# Patient Record
Sex: Male | Born: 1960 | Race: White | Hispanic: No | Marital: Married | Smoking: Former smoker
Health system: Southern US, Community
[De-identification: ages and names within clinical notes are randomized; demographics above are authoritative.]

## PROBLEM LIST (undated history)

## (undated) DIAGNOSIS — I219 Acute myocardial infarction, unspecified: Secondary | ICD-10-CM

## (undated) DIAGNOSIS — Z789 Other specified health status: Secondary | ICD-10-CM

## (undated) DIAGNOSIS — R9430 Abnormal result of cardiovascular function study, unspecified: Secondary | ICD-10-CM

## (undated) DIAGNOSIS — K219 Gastro-esophageal reflux disease without esophagitis: Secondary | ICD-10-CM

## (undated) DIAGNOSIS — K449 Diaphragmatic hernia without obstruction or gangrene: Secondary | ICD-10-CM

## (undated) DIAGNOSIS — I2510 Atherosclerotic heart disease of native coronary artery without angina pectoris: Secondary | ICD-10-CM

## (undated) DIAGNOSIS — E785 Hyperlipidemia, unspecified: Secondary | ICD-10-CM

## (undated) DIAGNOSIS — I10 Essential (primary) hypertension: Secondary | ICD-10-CM

## (undated) DIAGNOSIS — Z7289 Other problems related to lifestyle: Secondary | ICD-10-CM

## (undated) HISTORY — DX: Hyperlipidemia, unspecified: E78.5

## (undated) HISTORY — PX: CORONARY ANGIOPLASTY WITH STENT PLACEMENT: SHX49

## (undated) HISTORY — DX: Atherosclerotic heart disease of native coronary artery without angina pectoris: I25.10

## (undated) HISTORY — DX: Abnormal result of cardiovascular function study, unspecified: R94.30

## (undated) HISTORY — DX: Essential (primary) hypertension: I10

## (undated) HISTORY — DX: Other problems related to lifestyle: Z72.89

## (undated) HISTORY — DX: Diaphragmatic hernia without obstruction or gangrene: K44.9

## (undated) HISTORY — DX: Other specified health status: Z78.9

## (undated) HISTORY — DX: Gastro-esophageal reflux disease without esophagitis: K21.9

---
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
## 2000-06-17 ENCOUNTER — Emergency Department (HOSPITAL_COMMUNITY): Admission: EM | Admit: 2000-06-17 | Discharge: 2000-06-17 | Payer: Self-pay | Admitting: Emergency Medicine

## 2000-12-27 ENCOUNTER — Emergency Department (HOSPITAL_COMMUNITY): Admission: EM | Admit: 2000-12-27 | Discharge: 2000-12-27 | Payer: Self-pay

## 2001-01-09 ENCOUNTER — Emergency Department (HOSPITAL_COMMUNITY): Admission: EM | Admit: 2001-01-09 | Discharge: 2001-01-09 | Payer: Self-pay | Admitting: Emergency Medicine

## 2008-03-30 ENCOUNTER — Emergency Department (HOSPITAL_BASED_OUTPATIENT_CLINIC_OR_DEPARTMENT_OTHER): Admission: EM | Admit: 2008-03-30 | Discharge: 2008-03-30 | Payer: Self-pay | Admitting: Emergency Medicine

## 2008-03-30 ENCOUNTER — Ambulatory Visit: Payer: Self-pay | Admitting: Diagnostic Radiology

## 2011-01-02 ENCOUNTER — Observation Stay (HOSPITAL_COMMUNITY)
Admission: EM | Admit: 2011-01-02 | Discharge: 2011-01-03 | Disposition: A | Discharge status: Home / Self Care | Payer: Self-pay | Attending: Cardiology | Admitting: Cardiology

## 2011-01-02 ENCOUNTER — Emergency Department (HOSPITAL_COMMUNITY): Payer: Self-pay

## 2011-01-02 DIAGNOSIS — R079 Chest pain, unspecified: Secondary | ICD-10-CM

## 2011-01-02 DIAGNOSIS — I214 Non-ST elevation (NSTEMI) myocardial infarction: Principal | ICD-10-CM | POA: Insufficient documentation

## 2011-01-02 DIAGNOSIS — I2589 Other forms of chronic ischemic heart disease: Secondary | ICD-10-CM | POA: Insufficient documentation

## 2011-01-02 DIAGNOSIS — I10 Essential (primary) hypertension: Secondary | ICD-10-CM | POA: Insufficient documentation

## 2011-01-02 DIAGNOSIS — I2510 Atherosclerotic heart disease of native coronary artery without angina pectoris: Secondary | ICD-10-CM

## 2011-01-02 DIAGNOSIS — R0602 Shortness of breath: Secondary | ICD-10-CM | POA: Insufficient documentation

## 2011-01-02 DIAGNOSIS — E785 Hyperlipidemia, unspecified: Secondary | ICD-10-CM | POA: Insufficient documentation

## 2011-01-02 LAB — CBC
HCT: 41.6 % (ref 39.0–52.0)
Hemoglobin: 14.2 g/dL (ref 13.0–17.0)
MCH: 32.1 pg (ref 26.0–34.0)
MCHC: 34.1 g/dL (ref 30.0–36.0)
MCV: 94.1 fL (ref 78.0–100.0)
Platelets: 220 10*3/uL (ref 150–400)
RBC: 4.42 MIL/uL (ref 4.22–5.81)
RDW: 12.6 % (ref 11.5–15.5)
WBC: 9.5 10*3/uL (ref 4.0–10.5)

## 2011-01-02 LAB — PROTIME-INR
INR: 1 (ref 0.00–1.49)
Prothrombin Time: 13.4 seconds (ref 11.6–15.2)

## 2011-01-02 LAB — COMPREHENSIVE METABOLIC PANEL
Albumin: 3.7 g/dL (ref 3.5–5.2)
Alkaline Phosphatase: 71 U/L (ref 39–117)
BUN: 11 mg/dL (ref 6–23)
Creatinine, Ser: 0.89 mg/dL (ref 0.50–1.35)
GFR calc Af Amer: >60 mL/min (ref >60)
Glucose, Bld: 90 mg/dL (ref 70–99)
Potassium: 4.1 mEq/L (ref 3.5–5.1)
Total Bilirubin: 0.3 mg/dL (ref 0.3–1.2)
Total Protein: 6.6 g/dL (ref 6.0–8.3)

## 2011-01-02 LAB — CARDIAC PANEL(CRET KIN+CKTOT+MB+TROPI)
Relative Index: 6.9 — ABNORMAL HIGH (ref 0.0–2.5)
Total CK: 179 U/L (ref 7–232)

## 2011-01-02 LAB — POCT I-STAT TROPONIN I: Troponin i, poc: 0.17 ng/mL (ref 0.00–0.08)

## 2011-01-02 LAB — TSH: TSH: 1.815 u[IU]/mL (ref 0.350–4.500)

## 2011-01-02 LAB — HEMOGLOBIN A1C: Mean Plasma Glucose: 111 mg/dL (ref <117)

## 2011-01-02 LAB — POCT ACTIVATED CLOTTING TIME: Activated Clotting Time: 672 seconds

## 2011-01-03 DIAGNOSIS — I214 Non-ST elevation (NSTEMI) myocardial infarction: Secondary | ICD-10-CM

## 2011-01-03 LAB — BASIC METABOLIC PANEL
CO2: 26 mEq/L (ref 19–32)
Chloride: 106 mEq/L (ref 96–112)
GFR calc Af Amer: >60 mL/min (ref >60)
Potassium: 3.9 mEq/L (ref 3.5–5.1)

## 2011-01-03 LAB — LIPID PANEL
Cholesterol: 195 mg/dL (ref 0–200)
HDL: 46 mg/dL (ref >39)
Total CHOL/HDL Ratio: 4.2 RATIO
Triglycerides: 105 mg/dL (ref <150)
VLDL: 21 mg/dL (ref 0–40)

## 2011-01-03 LAB — CBC
Platelets: 228 10*3/uL (ref 150–400)
RBC: 4.48 MIL/uL (ref 4.22–5.81)
RDW: 12.7 % (ref 11.5–15.5)
WBC: 11.5 10*3/uL — ABNORMAL HIGH (ref 4.0–10.5)
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
## 2011-01-05 NOTE — Discharge Summary (Addendum)
Ernest Bishop, Ernest Bishop NO.:  [id number]  MEDICAL RECORD NO.:  [id number]  LOCATION:  2501                         FACILITY:  [facility]  PHYSICIAN:  Cassell Clement, M.D. DATE OF BIRTH:  1960-07-28  DATE OF ADMISSION:  01/02/2011 DATE OF DISCHARGE:  01/03/2011                              DISCHARGE SUMMARY   DISCHARGE DIAGNOSES: 1. Non-ST-elevation myocardial infarction.     a.     Secondary to severe obtuse marginal stenosis this admission.     b.     Newly diagnosed two-vessel coronary artery disease status      post percutaneous transluminal coronary angioplasty/drug-eluting      stent placement to the obtuse marginal, also residual chronically      occluded small right coronary artery with collaterals. 2. Ischemic cardiomyopathy with ejection fraction of 40% by     catheterization, January 03, 2011.     a.     Recommend followup echo as outpatient. 3. Uncontrolled hypertension, medication initiated this admission. 4. Dyslipidemia, lipids were initiated.     a.     Outpatient fasting lipids and LFTs scheduled for 6 weeks. 5. Regular alcohol use, counseled on cutting down. 6. History of hiatal hernia.  HOSPITAL COURSE:  Ernest Bishop is a 50 year old gentleman with no prior cardiac history but history of hypertension that has gone untreated for at least the past year.  He presented to the [facility] with complaints of chest pain associated nausea, diaphoresis, and shortness of breath.  His initial EKG was unremarkable.  Point-of-care troponin was 0.17.  His symptoms were felt very concerning for unstable angina/NSTEMI, so he underwent cardiac catheterization which demonstrated a severe stenosis in the OM branch of the circumflex.  Dr. Clifton James successfully placed a drug-eluting stent x1 successfully.  He was also noted to have a chronically occluded small nondominant RCA with collaterals filling.  EF was 40% by catheterization.  He was initiated on  ACE inhibitor, beta-blocker, aspirin, statin, and Effient.  His blood pressure, which had been uncontrolled on admission was watched and varied from the 140-160 range by day 2.  His lisinopril will be started today and we will also add hydrochlorothiazide.  The patient is doing much better and has no chest pain or shortness of breath.  Medications are being uptitrated for his blood pressure.  Of note, he did rule in for an NSTEMI of the peak troponin of 3.45.  The patient is very anxious to be discharged today.  He ambulated with Cardiac Rehab without difficulty.  Dr. Patty Sermons has seen and examined him today and feels he is stable for discharge.  DISCHARGE LABORATORY DATA:  Troponin 3.45 at peak, with recent 3.16. Sodium 139, potassium 3.9, chloride 106, CO2 26, glucose 101, BUN 8, and creatinine 0.80.  LFTs were normal on January 02, 2011.  WBC 11.5, hemoglobin 14.5, hematocrit 42.5, and platelet count 228.  STUDIES: 1. Chest x-ray showed no active disease. 2. Cardiac catheterization, January 02, 2011, please see full report     for details as well as HPI for summary.  DISCHARGE MEDICATIONS: 1. Aspirin 81 mg daily. 2. Hydrochlorothiazide 12.5 mg daily. 3. Lipitor 20 mg at bedtime. 4. Lisinopril  10 mg daily. 5. Metoprolol tartrate 25 mg b.i.d. 6. Nitro sublingual 0.4 mg every 5 minutes as needed up to 3 doses for     chest pain. 7. Effient 10 mg daily.  DISPOSITION:  Ernest Bishop will be discharged in stable condition to home.  He is not to return to work until cleared by cardiologist in the office.  He may not lift anything over 5 pounds for 1 week, drive for 2 days, or participate in sexual activity for 1 week.  He is to follow a low-sodium heart-healthy diet.  To call or return if he notices any pain, swelling, bleeding, or pus at his cath site.  Our office will call him with a followup appointment for the next week.  He is also instructed to get a primary care doctor.   He is very interested in discussing antianxiety medications in the future.  Ernest Bishop seems very motivated to make healthy changes to his lifestyle.  DURATION OF DISCHARGE ENCOUNTER:  Greater than 30 minutes including physician PA time.     Ronie Spies, P.A.C.   ______________________________ Cassell Clement, M.D.    DD/MEDQ  D:  01/03/2011  T:  01/03/2011  Job:  [id number]  cc:   Gordy Savers, MD  Electronically Signed by Cassell Clement M.D. on 01/05/2011 09:26:14 AM Electronically Signed by Ronie Spies  on 01/07/2011 09:30:22 PM

## 2011-01-05 NOTE — Cardiovascular Report (Signed)
NAMELAMONTE, HARTT NO.:  [id number]  MEDICAL RECORD NO.:  [id number]  LOCATION:  2501                         FACILITY:  [facility]  PHYSICIAN:  Verne Carrow, MDDATE OF BIRTH:  [birth year]  DATE OF PROCEDURE:  01/02/2011 DATE OF DISCHARGE:                           CARDIAC CATHETERIZATION   PRIMARY CARDIOLOGIST:  Luis Abed, MD, [facility]  PROCEDURES PERFORMED: 1. Left heart catheterization. 2. Selective coronary angiography. 3. Left ventricular angiogram. 4. Percutaneous transluminal coronary angioplasty with placement of a     drug-eluting stent in the proximal portion of the first obtuse     marginal branch of the circumflex artery.  OPERATOR:  Verne Carrow, MD  ARTERIAL ACCESS SITE:  Right radial artery.  INDICATIONS:  This is a 50 year old Caucasian male with a history of hypertension who presented to the emergency department today with complaints of chest pain.  The patient's EKG showed no ischemia.  His initial troponin was positive mildly at 0.17 with the point of care labs.  Because of this, a diagnostic catheterization was arranged.  PROCEDURE IN DETAIL:  The patient was brought to the main cardiac catheterization laboratory after signing informed consent for the procedure.  The right wrist was assessed with an Allen's test.  The Allen test was positive.  The right wrist was prepped and draped in sterile fashion.  Lidocaine 1% was used for local anesthesia.  A 5- French sheath was inserted without difficulty into the right radial artery.  Verapamil 3 mg was given through the sheath after insertion. Intravenous heparin 5000 units was given after sheath insertion. Standard diagnostic catheters were used to perform selective coronary angiography.  A pigtail catheter was used to perform a left ventricular angiogram.  At this point, we elected to proceed intervention of the severe stenosis in the first obtuse marginal branch of the  circumflex artery.  The sheath was upsized to a 6-French sheath.  The patient was given a bolus of Angiomax and a drip was started.  The patient was given 60 mg of Effient.  We then engaged the left main artery with a 6-French XB 3.5 guiding catheter.  When the ACT was greater than 200, we passed a cougar intracoronary wire down the length of the obtuse marginal branch into the distal vessel.  A 2.5 x 12-mm balloon was used to predilate the lesion.  A 2.5 x 60-mm Promus element drug-eluting stent was carefully positioned in the obtuse marginal branch and deployed without difficulty.  A 2.75 x 12-mm noncompliant balloon was positioned inside the stented segment and inflated to 14 atmospheres.  There was an excellent angiographic result.  The stenosis was taken from 99% to 0%. There was excellent flow into the distal vessel.  The guiding catheter was removed.  The sheath was removed and a Terumo hemostasis band was applied over the arteriotomy site.  The patient was taken to the recovery area in stable condition.  HEMODYNAMIC FINDINGS:  Central aortic pressure 181/120.  Left ventricular pressure 172/13/23.  ANGIOGRAPHIC FINDINGS: 1. The left main coronary artery had no evidence of disease. 2. Left anterior descending was a large vessel that coursed to the     apex and gave off a  moderate-sized diagonal branch with mild plaque     disease.  There were no flow-limiting lesions in this vessel. 3. The circumflex artery had mild plaque disease in the proximal     midportion.  First obtuse marginal branch was a moderate-sized     vessel with 99% stenosis which was felt to be the culprit lesion.     The second and third obtuse marginal branches had mild plaque     disease. 4. The right coronary artery is a small nondominant vessel with 100%     mid occlusion which is felt to be chronic.  The distal vessel fills     from right to right bridging collaterals. 5. Left ventricular angiogram was  performed in the RAO projection     showed anterolateral hypokinesis with ejection fraction of 40%.  IMPRESSION: 1. Double-vessel coronary artery disease. 2. Non-ST-elevation myocardial infarction secondary to severe stenosis     in the obtuse marginal branch of the circumflex artery.  Now status     post successful PTCA with placement of a drug-eluting stent x1 in     the obtuse marginal branch. 3. Chronically occluded small nondominant right coronary artery with     collateral filling. 4. Mild left ventricular systolic dysfunction.  RECOMMENDATIONS:  The patient should be continued on aspirin and Effient for at least 1 year.  We will also continue his beta-blocker and statin. He should be started on ACE inhibitor if his blood pressure will tolerate.     Verne Carrow, MD     CM/MEDQ  D:  01/02/2011  T:  01/03/2011  Job:  [id number]  cc:   Luis Abed, MD, [facility]  Electronically Signed by Verne Carrow MD on 01/05/2011 09:10:36 AM

## 2011-01-12 ENCOUNTER — Encounter: Payer: Self-pay | Admitting: Cardiology

## 2011-01-12 DIAGNOSIS — I2510 Atherosclerotic heart disease of native coronary artery without angina pectoris: Secondary | ICD-10-CM | POA: Insufficient documentation

## 2011-01-12 DIAGNOSIS — Z7289 Other problems related to lifestyle: Secondary | ICD-10-CM | POA: Insufficient documentation

## 2011-01-12 DIAGNOSIS — I10 Essential (primary) hypertension: Secondary | ICD-10-CM | POA: Insufficient documentation

## 2011-01-12 DIAGNOSIS — E785 Hyperlipidemia, unspecified: Secondary | ICD-10-CM | POA: Insufficient documentation

## 2011-01-12 DIAGNOSIS — Z789 Other specified health status: Secondary | ICD-10-CM | POA: Insufficient documentation

## 2011-01-13 ENCOUNTER — Ambulatory Visit (INDEPENDENT_AMBULATORY_CARE_PROVIDER_SITE_OTHER): Payer: Self-pay | Admitting: Cardiology

## 2011-01-13 ENCOUNTER — Encounter: Payer: Self-pay | Admitting: Cardiology

## 2011-01-13 DIAGNOSIS — E785 Hyperlipidemia, unspecified: Secondary | ICD-10-CM

## 2011-01-13 DIAGNOSIS — I2510 Atherosclerotic heart disease of native coronary artery without angina pectoris: Secondary | ICD-10-CM

## 2011-01-13 DIAGNOSIS — I10 Essential (primary) hypertension: Secondary | ICD-10-CM

## 2011-01-13 DIAGNOSIS — I428 Other cardiomyopathies: Secondary | ICD-10-CM

## 2011-01-13 MED ORDER — LISINOPRIL 20 MG PO TABS: 20.0000 mg | ORAL_TABLET | Freq: Every day | ORAL | Status: DC

## 2011-01-13 NOTE — Assessment & Plan Note (Signed)
The patient is status post recent circumflex occlusion with urgent opening with a stent.  We need to reassess his LV function and an echo will be obtained.  He has not had any recurrent significant symptoms.  He is on aspirin and Prasugrel.  These will be continued.

## 2011-01-13 NOTE — Assessment & Plan Note (Signed)
The patient's ejection fraction was 40% in the catheter lab.  We need to reassess now that his circumflex has been opened.  There may be a component of hypertensive disease also.  Blood pressure is reasonable today but his lisinopril dose will be increased.  He is on a beta blocker and an ACE inhibitor.  I will decide over time about the titration upwards of other medicines.

## 2011-01-13 NOTE — Patient Instructions (Signed)
Your physician recommends that you schedule a follow-up appointment in: 10 DAYS WITH DR Myrtis Ser Your physician has recommended you make the following change in your medication: INCREASE LISINOPRIL TO 20 MG EVERY DAY   Your physician has requested that you have an echocardiogram. Echocardiography is a painless test that uses sound waves to create images of your heart. It provides your doctor with information about the size and shape of your heart and how well your heart's chambers and valves are working. This procedure takes approximately one hour. There are no restrictions for this procedure. EVAL LV FUNCTION   HAVE DONE IN 1 WEEK PRIOR TO APPT WITH DR Myrtis Ser

## 2011-01-13 NOTE — Assessment & Plan Note (Signed)
There is borderline control of his blood pressure today.  I will be increasing his lisinopril dose.  I will not be increasing his beta blocker dose at this time.  His rate at home was 52 and he is worried that he might have fatigue from bradycardia
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
## 2011-01-13 NOTE — Assessment & Plan Note (Signed)
Patient is on medications for his lipids.

## 2011-01-19 ENCOUNTER — Ambulatory Visit (HOSPITAL_COMMUNITY): Payer: Self-pay | Attending: Cardiology | Admitting: Radiology

## 2011-01-19 DIAGNOSIS — R079 Chest pain, unspecified: Secondary | ICD-10-CM | POA: Insufficient documentation

## 2011-01-19 DIAGNOSIS — E785 Hyperlipidemia, unspecified: Secondary | ICD-10-CM | POA: Insufficient documentation

## 2011-01-19 DIAGNOSIS — I10 Essential (primary) hypertension: Secondary | ICD-10-CM | POA: Insufficient documentation

## 2011-01-19 DIAGNOSIS — I2589 Other forms of chronic ischemic heart disease: Secondary | ICD-10-CM

## 2011-01-19 DIAGNOSIS — I2510 Atherosclerotic heart disease of native coronary artery without angina pectoris: Secondary | ICD-10-CM

## 2011-01-20 ENCOUNTER — Ambulatory Visit (INDEPENDENT_AMBULATORY_CARE_PROVIDER_SITE_OTHER): Payer: Self-pay | Admitting: Family Medicine

## 2011-01-20 ENCOUNTER — Encounter: Payer: Self-pay | Admitting: Family Medicine

## 2011-01-20 VITALS — BP 110/64 | HR 72 | Temp 98.6°F | Ht 70.75 in | Wt 227.0 lb

## 2011-01-20 DIAGNOSIS — F419 Anxiety disorder, unspecified: Secondary | ICD-10-CM

## 2011-01-20 DIAGNOSIS — Z23 Encounter for immunization: Secondary | ICD-10-CM

## 2011-01-20 DIAGNOSIS — I10 Essential (primary) hypertension: Secondary | ICD-10-CM

## 2011-01-20 DIAGNOSIS — F411 Generalized anxiety disorder: Secondary | ICD-10-CM

## 2011-01-20 DIAGNOSIS — I2510 Atherosclerotic heart disease of native coronary artery without angina pectoris: Secondary | ICD-10-CM

## 2011-01-20 DIAGNOSIS — E785 Hyperlipidemia, unspecified: Secondary | ICD-10-CM

## 2011-01-20 MED ORDER — ALPRAZOLAM 0.5 MG PO TABS: 0.5000 mg | ORAL_TABLET | Freq: Three times a day (TID) | ORAL | Status: AC | PRN
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
## 2011-01-21 ENCOUNTER — Ambulatory Visit (INDEPENDENT_AMBULATORY_CARE_PROVIDER_SITE_OTHER): Payer: Self-pay | Admitting: Cardiology

## 2011-01-21 ENCOUNTER — Encounter: Payer: Self-pay | Admitting: Cardiology

## 2011-01-21 DIAGNOSIS — IMO0002 Reserved for concepts with insufficient information to code with codable children: Secondary | ICD-10-CM | POA: Insufficient documentation

## 2011-01-21 DIAGNOSIS — I2510 Atherosclerotic heart disease of native coronary artery without angina pectoris: Secondary | ICD-10-CM

## 2011-01-21 DIAGNOSIS — R0989 Other specified symptoms and signs involving the circulatory and respiratory systems: Secondary | ICD-10-CM

## 2011-01-21 DIAGNOSIS — R9430 Abnormal result of cardiovascular function study, unspecified: Secondary | ICD-10-CM

## 2011-01-21 DIAGNOSIS — I10 Essential (primary) hypertension: Secondary | ICD-10-CM

## 2011-01-21 NOTE — Patient Instructions (Signed)
Your physician wants you to follow-up in: 3 MONTHS WITH DR Chancy Hurter will receive a reminder letter in the mail two months in advance. If you don't receive a letter, please call our office to schedule the follow-up appointment. Your physician recommends that you continue on your current medications as directed. Please refer to the Current Medication list given to you today.

## 2011-01-21 NOTE — Assessment & Plan Note (Signed)
Coronary disease is stable.  We now know that he has return of normal function.  I will not be changing his medicines today.  He is to increase his activity and will see him back in 3 months.

## 2011-01-21 NOTE — Assessment & Plan Note (Signed)
Blood pressure is now nicely controlled.  No change in therapy.

## 2011-01-21 NOTE — Assessment & Plan Note (Signed)
His ejection fraction has now normalized to 55% with no definite focal wall motion abnormalities.  He clearly benefited from his urgent PCI.

## 2011-01-21 NOTE — Progress Notes (Signed)
HPI Patient is seen for follow up of coronary disease.  He had a non-ST elevation MI January 02, 2011.  He received a drug-eluting stent to an occluded OM.  He had a chronically occluded small right coronary with collaterals.  He did very well.  Ejection fraction was in the 40% range in the catheterization lab.  Therefore echo was done on January 19, 2011.  I have personally reviewed the echo images.  I agree with the reading that his ejection fraction is now in the 55% range with no significant wall motion abnormalities.  He's had an excellent recovery.  He is feeling well and increasing his activities.  He seems quite motivated.   Allergies  Allergen Reactions  . Codeine Itching    Current Outpatient Prescriptions  Medication Sig Dispense Refill  . ALPRAZolam (XANAX) 0.5 MG tablet Take 1 tablet (0.5 mg total) by mouth 3 (three) times daily as needed for sleep or anxiety.  60 tablet  2  . aspirin 81 MG tablet Take 81 mg by mouth daily.        Marland Kitchen atorvastatin (LIPITOR) 20 MG tablet Take 20 mg by mouth daily.        . hydrochlorothiazide (MICROZIDE) 12.5 MG capsule Take 12.5 mg by mouth daily.        Marland Kitchen lisinopril (PRINIVIL,ZESTRIL) 20 MG tablet Take 1 tablet (20 mg total) by mouth daily.  30 tablet  11  . metoprolol tartrate (LOPRESSOR) 25 MG tablet Take 25 mg by mouth 2 (two) times daily.        . nitroGLYCERIN (NITROSTAT) 0.4 MG SL tablet Place 0.4 mg under the tongue every 5 (five) minutes as needed.        . prasugrel (EFFIENT) 10 MG TABS Take 10 mg by mouth daily.          History   Social History  . Marital Status: Married    Spouse Name: N/A    Number of Children: 1  . Years of Education: N/A   Occupational History  . flooring    Social History Main Topics  . Smoking status: Former Smoker    Types: Cigarettes  . Smokeless tobacco: Never Used  . Alcohol Use: 3.5 oz/week    7 drink(s) per week     2-3 12oz cans of beer daily  . Drug Use: No  . Sexually Active: Not on  file   Other Topics Concern  . Not on file   Social History Narrative  . No narrative on file    Family History  Problem Relation Age of Onset  . Hypertension Mother   . Arthritis Mother   . Tremor Mother   . Alcohol abuse Father     had heart surgery, family is estranged from father  . Hypertension Brother     Past Medical History  Diagnosis Date  . CAD (coronary artery disease)     Non-STEMI January 02, 2011, DES to OM, residual chronically occluded small RCA with collaterals  . Cardiomyopathy     Ischemic,,Hypertensive  . Ejection fraction < 50%     EF of 40%, catheterization, September, 2012, needs followup echo  . Hypertension   . Dyslipidemia   . Alcohol use     Regular alcohol use, to cut down   . Hiatal hernia     history  . GERD (gastroesophageal reflux disease)   . Hyperlipidemia     Past Surgical History  Procedure Date  . Cardiac catheterization 01/02/2011  .  Other surgical history     intestinal repair surgery after traumatic injury at the age of 7    ROS  Patient denies fever, chills, headache, sweats, rash, he is hearing, chest pain, cough, nausea vomiting, urinary symptoms.  All other systems are reviewed and are negative. PHYSICAL EXAM Patient is here with his wife.  He is oriented to person time and place.  Affect is normal.  Head is atraumatic.  Lungs are clear.  Respiratory effort is nonlabored.  Cardiac exam reveals S1-S2.  No clicks or significant murmurs.  The abdomen is soft it is improved edema. Filed Vitals:   01/21/11 1001  BP: 112/66  Pulse: 75  Height: 5\' 10"  (1.778 m)  Weight: 224 lb (101.606 kg)    EKG is not done today.  ASSESSMENT & PLAN

## 2011-01-31 ENCOUNTER — Other Ambulatory Visit: Payer: Self-pay | Admitting: Internal Medicine

## 2011-03-30 ENCOUNTER — Encounter: Payer: Self-pay | Admitting: Family Medicine

## 2011-03-30 ENCOUNTER — Ambulatory Visit (INDEPENDENT_AMBULATORY_CARE_PROVIDER_SITE_OTHER): Payer: Self-pay | Admitting: Family Medicine

## 2011-03-30 VITALS — BP 122/84 | HR 53 | Temp 99.0°F | Wt 220.0 lb

## 2011-03-30 DIAGNOSIS — J329 Chronic sinusitis, unspecified: Secondary | ICD-10-CM

## 2011-03-30 DIAGNOSIS — F419 Anxiety disorder, unspecified: Secondary | ICD-10-CM

## 2011-03-30 DIAGNOSIS — F411 Generalized anxiety disorder: Secondary | ICD-10-CM

## 2011-03-30 MED ORDER — ALPRAZOLAM 0.5 MG PO TABS: 0.5000 mg | ORAL_TABLET | Freq: Three times a day (TID) | ORAL | Status: AC | PRN

## 2011-03-30 MED ORDER — AZITHROMYCIN 250 MG PO TABS: ORAL_TABLET | ORAL | Status: AC

## 2011-03-30 NOTE — Progress Notes (Signed)
  Subjective:    Patient ID: Ernest Bishop, male    DOB: 06-02-1960, 50 y.o.   MRN: [id number]  HPI Here to follow up on anxiety. We started him on Xanax in October after he had an MI in September and was dealing with a number of stressful life issues. He has done well with this, averaging twice a day. He is back to work part time now. He has had a stuffy head, PND, and dry cough for the past 3 weeks. No fever. No SOB or chest pain.    Review of Systems  Constitutional: Negative.   HENT: Positive for congestion, postnasal drip and sinus pressure.   Eyes: Negative.   Respiratory: Positive for cough.   Cardiovascular: Negative.        Objective:   Physical Exam  Constitutional: He appears well-developed and well-nourished.  HENT:  Right Ear: External ear normal.  Left Ear: External ear normal.  Nose: Nose normal.  Mouth/Throat: Oropharynx is clear and moist. No oropharyngeal exudate.  Eyes: Conjunctivae are normal.  Cardiovascular: Regular rhythm, normal heart sounds and intact distal pulses.   Pulmonary/Chest: Effort normal and breath sounds normal.  Lymphadenopathy:    He has no cervical adenopathy.          Assessment & Plan:  His anxiety is stable. Refilled Xanax. Treat the sinusitis.

## 2011-06-10 ENCOUNTER — Ambulatory Visit (INDEPENDENT_AMBULATORY_CARE_PROVIDER_SITE_OTHER): Payer: Self-pay | Admitting: Family Medicine

## 2011-06-10 ENCOUNTER — Encounter: Payer: Self-pay | Admitting: Family Medicine

## 2011-06-10 VITALS — BP 112/64 | HR 66 | Temp 98.4°F | Wt 222.0 lb

## 2011-06-10 DIAGNOSIS — M25469 Effusion, unspecified knee: Secondary | ICD-10-CM

## 2011-06-10 NOTE — Progress Notes (Signed)
  Subjective:    Patient ID: Ernest Bishop, male    DOB: [birth year], 51 y.o.   MRN: [id number]  HPI Here for 2 weeks of swelling in the left knee after he fell on the knee. There is little pain but it feels tight. It is painful when he crawls on his knees, and his job does require a lot of crawling.    Review of Systems  Constitutional: Negative.   Musculoskeletal: Positive for joint swelling.       Objective:   Physical Exam  Constitutional: He appears well-developed and well-nourished.  Musculoskeletal:       The anterior left knee has a bursa effusion beneath the patellar tendon. ROM is full. Not tender or warm.           Assessment & Plan:  Using a lateral approach, we were able to aspirate 12 cc of bloody fluid using a syringe and 18 gauge needle. The knee was then wrapped with an ACE wrap to apply gentle pressure. He will stay off it the rest of the day, and he will use ice packs prn

## 2011-08-06 ENCOUNTER — Other Ambulatory Visit: Payer: Self-pay | Admitting: Cardiology

## 2011-08-06 ENCOUNTER — Telehealth: Payer: Self-pay | Admitting: Cardiology

## 2011-08-06 MED ORDER — ATORVASTATIN CALCIUM 20 MG PO TABS: 20.0000 mg | ORAL_TABLET | Freq: Every day | ORAL | Status: DC

## 2011-08-06 NOTE — Telephone Encounter (Signed)
New Problem:     Patient called in needing refills of his atorvastatin (LIPITOR) 20 MG tablet, hydrochlorothiazide (MICROZIDE) 12.5 MG capsule and a "green pill". Please call back.

## 2011-08-07 MED ORDER — ATORVASTATIN CALCIUM 20 MG PO TABS: 20.0000 mg | ORAL_TABLET | Freq: Every day | ORAL | Status: DC

## 2011-08-07 MED ORDER — LISINOPRIL 20 MG PO TABS: 20.0000 mg | ORAL_TABLET | Freq: Every day | ORAL | Status: DC

## 2011-08-07 MED ORDER — PRASUGREL HCL 10 MG PO TABS: 10.0000 mg | ORAL_TABLET | Freq: Every day | ORAL | Status: DC

## 2011-08-07 MED ORDER — HYDROCHLOROTHIAZIDE 12.5 MG PO CAPS: 12.5000 mg | ORAL_CAPSULE | Freq: Every day | ORAL | Status: DC

## 2011-08-07 MED ORDER — METOPROLOL TARTRATE 25 MG PO TABS: 25.0000 mg | ORAL_TABLET | Freq: Two times a day (BID) | ORAL | Status: DC

## 2011-10-01 ENCOUNTER — Other Ambulatory Visit: Payer: Self-pay

## 2011-10-01 MED ORDER — METOPROLOL TARTRATE 25 MG PO TABS: 25.0000 mg | ORAL_TABLET | Freq: Two times a day (BID) | ORAL | Status: DC

## 2011-10-12 ENCOUNTER — Telehealth: Payer: Self-pay | Admitting: Family Medicine

## 2011-10-12 MED ORDER — ALPRAZOLAM 0.5 MG PO TABS: 0.5000 mg | ORAL_TABLET | Freq: Three times a day (TID) | ORAL | Status: DC | PRN

## 2011-10-12 NOTE — Telephone Encounter (Signed)
Pt requested a refill for Xanax 0.5 mg and he is leaving out of town in the morning. He said that pharmacy should have sent over a request.

## 2011-10-12 NOTE — Telephone Encounter (Signed)
Call in #90 with 5 rf 

## 2011-10-12 NOTE — Telephone Encounter (Signed)
I called in script and left voice message for pt. 

## 2012-03-01 ENCOUNTER — Ambulatory Visit (INDEPENDENT_AMBULATORY_CARE_PROVIDER_SITE_OTHER): Payer: Self-pay | Admitting: Cardiology

## 2012-03-01 ENCOUNTER — Encounter: Payer: Self-pay | Admitting: Cardiology

## 2012-03-01 VITALS — BP 114/68 | HR 54 | Ht 70.0 in | Wt 225.0 lb

## 2012-03-01 DIAGNOSIS — R0989 Other specified symptoms and signs involving the circulatory and respiratory systems: Secondary | ICD-10-CM

## 2012-03-01 DIAGNOSIS — E785 Hyperlipidemia, unspecified: Secondary | ICD-10-CM

## 2012-03-01 DIAGNOSIS — I10 Essential (primary) hypertension: Secondary | ICD-10-CM

## 2012-03-01 DIAGNOSIS — I2510 Atherosclerotic heart disease of native coronary artery without angina pectoris: Secondary | ICD-10-CM

## 2012-03-01 DIAGNOSIS — R9430 Abnormal result of cardiovascular function study, unspecified: Secondary | ICD-10-CM

## 2012-03-01 MED ORDER — METOPROLOL TARTRATE 25 MG PO TABS: 25.0000 mg | ORAL_TABLET | Freq: Two times a day (BID) | ORAL | Status: DC

## 2012-03-01 MED ORDER — HYDROCHLOROTHIAZIDE 12.5 MG PO CAPS: 12.5000 mg | ORAL_CAPSULE | Freq: Every day | ORAL | Status: DC

## 2012-03-01 MED ORDER — NITROGLYCERIN 0.4 MG SL SUBL: 0.4000 mg | SUBLINGUAL_TABLET | SUBLINGUAL | Status: DC | PRN

## 2012-03-01 MED ORDER — LISINOPRIL 20 MG PO TABS: 20.0000 mg | ORAL_TABLET | Freq: Every day | ORAL | Status: DC

## 2012-03-01 MED ORDER — ATORVASTATIN CALCIUM 20 MG PO TABS: 20.0000 mg | ORAL_TABLET | Freq: Every day | ORAL | Status: DC

## 2012-03-01 NOTE — Assessment & Plan Note (Signed)
Blood pressure is controlled. No change in therapy. 

## 2012-03-01 NOTE — Assessment & Plan Note (Signed)
His ejection fraction had been somewhat reduced at the time of his MI. It normalized by echo October, 2012. No further workup. Plan followup 1 year.

## 2012-03-01 NOTE — Patient Instructions (Addendum)
Your physician wants you to follow-up in: 1 year.   You will receive a reminder letter in the mail two months in advance. If you don't receive a letter, please call our office to schedule the follow-up appointment.  Your physician has recommended you make the following change in your medication: STOP your effient

## 2012-03-01 NOTE — Assessment & Plan Note (Signed)
Patient continues on his statin. I discussed this with him and his wife. No change in therapy.

## 2012-03-01 NOTE — Assessment & Plan Note (Signed)
Coronary disease is stable. He's not having any significant symptoms. He stopped his prasugrel when it ran out 2 weeks ago. Fortunately he's been on for over one year. His aspirin has been continued. I feel it is safe for him to remain off dual antiplatelet therapy. He noticed remain on aspirin. He does not need any further workup at this time.

## 2012-03-01 NOTE — Progress Notes (Signed)
HPI  The patient is seen for followup coronary artery disease. I saw him last October, 2012. The patient had a non-ST elevation MI in September, 2012. He received a drug-eluting stent to an occluded OM. He had a chronically occluded small right with collaterals. He did well. Initially his ejection fraction was 40%. Followup echo in October, 2012 revealed an ejection fraction of 55%. The patient has felt well. He is not having any significant symptoms. He is prasugrel ran out 2 weeks ago. He did not call haven't restarted. However he had been on it more than one year. He has been continuing his aspirin.  Allergies  Allergen Reactions  . Codeine Itching  . Hydrocodone Itching    Current Outpatient Prescriptions  Medication Sig Dispense Refill  . ALPRAZolam (XANAX) 0.5 MG tablet Take 1 tablet (0.5 mg total) by mouth 3 (three) times daily as needed.  90 tablet  5  . aspirin 81 MG tablet Take 81 mg by mouth daily.        Marland Kitchen atorvastatin (LIPITOR) 20 MG tablet Take 1 tablet (20 mg total) by mouth at bedtime.  30 tablet  11  . hydrochlorothiazide (MICROZIDE) 12.5 MG capsule Take 1 capsule (12.5 mg total) by mouth daily.  30 capsule  11  . lisinopril (PRINIVIL,ZESTRIL) 20 MG tablet Take 1 tablet (20 mg total) by mouth daily.  30 tablet  11  . metoprolol tartrate (LOPRESSOR) 25 MG tablet Take 1 tablet (25 mg total) by mouth 2 (two) times daily.  60 tablet  11  . nitroGLYCERIN (NITROSTAT) 0.4 MG SL tablet Place 1 tablet (0.4 mg total) under the tongue every 5 (five) minutes as needed.  25 tablet  11  . [DISCONTINUED] atorvastatin (LIPITOR) 20 MG tablet Take 1 tablet (20 mg total) by mouth at bedtime.  90 tablet  2  . [DISCONTINUED] hydrochlorothiazide (MICROZIDE) 12.5 MG capsule Take 1 capsule (12.5 mg total) by mouth daily.  90 capsule  2  . [DISCONTINUED] lisinopril (PRINIVIL,ZESTRIL) 20 MG tablet Take 1 tablet (20 mg total) by mouth daily.  90 tablet  2  . [DISCONTINUED] metoprolol tartrate  (LOPRESSOR) 25 MG tablet Take 1 tablet (25 mg total) by mouth 2 (two) times daily.  180 tablet  0  . [DISCONTINUED] nitroGLYCERIN (NITROSTAT) 0.4 MG SL tablet Place 0.4 mg under the tongue every 5 (five) minutes as needed.          History   Social History  . Marital Status: Married    Spouse Name: N/A    Number of Children: 1  . Years of Education: N/A   Occupational History  . flooring    Social History Main Topics  . Smoking status: Former Smoker    Types: Cigarettes  . Smokeless tobacco: Never Used  . Alcohol Use: 3.5 oz/week    7 drink(s) per week     Comment: 2-3 12oz cans of beer daily  . Drug Use: No  . Sexually Active: Not on file   Other Topics Concern  . Not on file   Social History Narrative  . No narrative on file    Family History  Problem Relation Age of Onset  . Hypertension Mother   . Arthritis Mother   . Tremor Mother   . Alcohol abuse Father     had heart surgery, family is estranged from father  . Hypertension Brother     Past Medical History  Diagnosis Date  . CAD (coronary artery disease)  Non-STEMI January 02, 2011, DES to OM, residual chronically occluded small RCA with collaterals  . Ejection fraction     EF 40% at the time of acute MI /  EF 55%, January 19, 2011, outpatient echo, no focal wall motion abnormalities.  . Hypertension   . Dyslipidemia   . Alcohol use     Regular alcohol use, to cut down   . Hiatal hernia     history  . GERD (gastroesophageal reflux disease)   . Hyperlipidemia     Past Surgical History  Procedure Date  . Cardiac catheterization 01/02/2011  . Other surgical history     intestinal repair surgery after traumatic injury at the age of 7    Patient Active Problem List  Diagnosis  . CAD (coronary artery disease)  . Hypertension  . Dyslipidemia  . Alcohol use  . Ejection fraction    ROS   Patient denies fever, chills, headache, sweats, rash, change in vision, change in hearing, chest pain,  cough, nausea, vomiting, urinary symptoms. All other systems are reviewed and are negative.  PHYSICAL EXAM  Patient is oriented to person time and place. Affect is normal. He is here with his wife. There is no jugulovenous distention. Lungs are clear. Respiratory effort is nonlabored. Cardiac exam her vitals S1 and S2. There no clicks or significant murmurs. The abdomen is soft. There is no peripheral edema.  Filed Vitals:   03/01/12 1204  BP: 114/68  Pulse: 54  Height: 5\' 10"  (1.778 m)  Weight: 225 lb (102.059 kg)   EKG is done today and reviewed by me. The EKG is normal with mild sinus bradycardia. ASSESSMENT & PLAN

## 2012-08-01 ENCOUNTER — Telehealth: Payer: Self-pay | Admitting: Family Medicine

## 2012-08-01 NOTE — Telephone Encounter (Signed)
Patient Information:  Caller Name: Ohm  Phone: [phone]  Patient: Ernest Bishop, Ernest Bishop Kevin1962/06/30  Gender: Male  DOB: [birth year]  Age: 52 Years  PCP: Gershon Crane [facility])  Office Follow Up:  Does the office need to follow up with this patient?: No  Instructions For The Office: N/A   Symptoms  Reason For Call & Symptoms: Had MI 2 years ago and had stent put in and was put on several BP  medicaitons and gets fatigued easily since 02/2012. He has not had physical in several years. Wondering if needs testosterone to help increase energy level.  Cardiologist has released him- requests appointment on 08/02/12 since he has moved and needs travel time.   Reviewed Health History In EMR: Yes  Reviewed Medications In EMR: Yes  Reviewed Allergies In EMR: Yes  Reviewed Surgeries / Procedures: Yes  Date of Onset of Symptoms: 02/05/2012  Guideline(s) Used:  Weakness (Generalized) and Fatigue  Disposition Per Guideline:   See Today in Office  Reason For Disposition Reached:   Taking a medicine that could cause weakness (e.g., blood pressure medications, diuretics)  Advice Given:  Reassurance  Weakness often accompanies dehydration,  viral illnesses (e.g., colds and flu).  Call Back If:  Unable to stand or walk  Passes out  Breathing difficulty occurs  You become worse.  Reassurance  Not drinking enough fluids and being a little dehydrated is a common cause of mild weakness.  Fluids  : Drink several glasses of fruit juice, other clear fluids, or water. This will improve hydration and blood glucose.  Rest  : Lie down with feet elevated for 1 hour. This will improve blood flow and increase blood flow to the brain.  Call Back If:   Still feeling weak after 2 hours of rest and fluids  Passes out (faints)  You become worse.  Cool Off  : If the weather is hot, apply a cold compress to the forehead or take a cool shower or bath.  Patient Will Follow Care Advice:   YES  Appointment Scheduled:  08/02/2012 10:45:00 Appointment Scheduled Provider:  Gershon Crane [facility])

## 2012-08-02 ENCOUNTER — Encounter: Payer: Self-pay | Admitting: Family Medicine

## 2012-08-02 ENCOUNTER — Ambulatory Visit (INDEPENDENT_AMBULATORY_CARE_PROVIDER_SITE_OTHER): Payer: Self-pay | Admitting: Family Medicine

## 2012-08-02 VITALS — BP 152/90 | HR 60 | Temp 98.3°F | Resp 16 | Ht 70.0 in | Wt 230.0 lb

## 2012-08-02 DIAGNOSIS — I2510 Atherosclerotic heart disease of native coronary artery without angina pectoris: Secondary | ICD-10-CM

## 2012-08-02 DIAGNOSIS — F329 Major depressive disorder, single episode, unspecified: Secondary | ICD-10-CM

## 2012-08-02 DIAGNOSIS — I10 Essential (primary) hypertension: Secondary | ICD-10-CM

## 2012-08-02 LAB — CBC WITH DIFFERENTIAL/PLATELET
Basophils Absolute: 0.1 10*3/uL (ref 0.0–0.1)
Basophils Relative: 0.6 % (ref 0.0–3.0)
Eosinophils Absolute: 0.4 10*3/uL (ref 0.0–0.7)
HCT: 45.3 % (ref 39.0–52.0)
Hemoglobin: 16.2 g/dL (ref 13.0–17.0)
Lymphs Abs: 3.2 10*3/uL (ref 0.7–4.0)
MCHC: 35.7 g/dL (ref 30.0–36.0)
MCV: 93.5 fl (ref 78.0–100.0)
Neutro Abs: 7.3 10*3/uL (ref 1.4–7.7)
RBC: 4.85 Mil/uL (ref 4.22–5.81)
RDW: 12.6 % (ref 11.5–14.6)

## 2012-08-02 LAB — POCT URINALYSIS DIPSTICK
Bilirubin, UA: NEGATIVE
Blood, UA: NEGATIVE
Ketones, UA: NEGATIVE
Protein, UA: NEGATIVE
Spec Grav, UA: 1.02
pH, UA: 7

## 2012-08-02 LAB — BASIC METABOLIC PANEL
BUN: 12 mg/dL (ref 6–23)
Chloride: 101 mEq/L (ref 96–112)
Potassium: 4.7 mEq/L (ref 3.5–5.1)
Sodium: 138 mEq/L (ref 135–145)

## 2012-08-02 LAB — HEPATIC FUNCTION PANEL
ALT: 26 U/L (ref 0–53)
AST: 29 U/L (ref 0–37)
Alkaline Phosphatase: 77 U/L (ref 39–117)
Bilirubin, Direct: 0.1 mg/dL (ref 0.0–0.3)
Total Bilirubin: 1.1 mg/dL (ref 0.3–1.2)

## 2012-08-02 LAB — LIPID PANEL: VLDL: 19.4 mg/dL (ref 0.0–40.0)

## 2012-08-02 MED ORDER — AMLODIPINE BESYLATE 5 MG PO TABS: 5.0000 mg | ORAL_TABLET | Freq: Every day | ORAL | Status: DC

## 2012-08-02 MED ORDER — ESCITALOPRAM OXALATE 10 MG PO TABS: 10.0000 mg | ORAL_TABLET | Freq: Every day | ORAL | Status: DC

## 2012-08-02 NOTE — Progress Notes (Signed)
  Subjective:    Patient ID: Ernest Bishop, male    DOB: 1960/12/20, 52 y.o.   MRN: [id number]  HPI Here with his wife for generalized fatigue and mood changes over the past 6 months. He had an MI in September 2012, and he has been seeing Dr. Myrtis Ser frequently for this. He has recovered well after this from a physical perspective. He denies any chest pains or SOB. He sleeps well. He feels fine when he first gets up every morning, but then when he starts to work or do something he rapidly feels fatigued. He takes his BP meds in the mornings, and he wonders if he is having side effects from these meds. He admits to feeling depressed and sad often, and his wife says he gets very moody and can have a very quick temper. He takes one xanax every morning. His BP has been stable.   Review of Systems  Constitutional: Positive for fatigue.  Respiratory: Negative.   Cardiovascular: Negative.   Gastrointestinal: Negative.   Neurological: Negative.   Psychiatric/Behavioral: Positive for dysphoric mood, decreased concentration and agitation. Negative for confusion. The patient is nervous/anxious.        Objective:   Physical Exam  Constitutional: He is oriented to person, place, and time. He appears well-developed and well-nourished.  Neck: Neck supple. No thyromegaly present.  Cardiovascular: Normal rate, regular rhythm, normal heart sounds and intact distal pulses.   Pulmonary/Chest: Effort normal and breath sounds normal.  Lymphadenopathy:    He has no cervical adenopathy.  Neurological: He is alert and oriented to person, place, and time.  Psychiatric: His behavior is normal. Thought content normal.  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 We spent some time discussing this, and they both agreed that he has been depressed. We will start him on Lexapro 10 mg daily. Get labs today  to rule out metabolic issues. He also mentioned a constant dry tickling cough for several months, so we thought this was related to his ACE inhibitor. Stop the Lisinopril and start Amlodipine. Recheck in 3 weeks

## 2012-08-23 ENCOUNTER — Ambulatory Visit (INDEPENDENT_AMBULATORY_CARE_PROVIDER_SITE_OTHER): Payer: Self-pay | Admitting: Family Medicine

## 2012-08-23 ENCOUNTER — Encounter: Payer: Self-pay | Admitting: Family Medicine

## 2012-08-23 VITALS — BP 140/82 | HR 63 | Temp 98.3°F | Wt 226.0 lb

## 2012-08-23 DIAGNOSIS — F341 Dysthymic disorder: Secondary | ICD-10-CM

## 2012-08-23 DIAGNOSIS — F418 Other specified anxiety disorders: Secondary | ICD-10-CM

## 2012-08-23 MED ORDER — ERYTHROMYCIN 5 MG/GM OP OINT: TOPICAL_OINTMENT | Freq: Four times a day (QID) | OPHTHALMIC | Status: DC

## 2012-08-23 MED ORDER — BUPROPION HCL ER (XL) 150 MG PO TB24: 150.0000 mg | ORAL_TABLET | Freq: Every day | ORAL | Status: DC

## 2012-08-23 MED ORDER — ALPRAZOLAM 0.5 MG PO TABS: 0.5000 mg | ORAL_TABLET | Freq: Three times a day (TID) | ORAL | Status: DC | PRN

## 2012-08-23 NOTE — Progress Notes (Signed)
  Subjective:    Patient ID: Ernest Bishop, male    DOB: 1961-02-11, 52 y.o.   MRN: [id number]  HPI Here to follow up on depression and anxiety. He has been on Lexapro for 3 weeks, but he does not think this is helping much. His wife says he is still moody and irritable. He sleeps well if he takes a Xanax at night.    Review of Systems  Constitutional: Negative.   Psychiatric/Behavioral: Positive for dysphoric mood, decreased concentration and agitation. Negative for confusion. The patient is nervous/anxious.        Objective:   Physical Exam  Constitutional: He appears well-developed and well-nourished.  Psychiatric: He has a normal mood and affect. His behavior is normal. Thought content normal.          Assessment & Plan:  We will stop the Lexapro and try Wellbutrin XL 150 mg daily. I asked him to increase the xanax to bid. Recheck in 3 weeks

## 2012-08-24 ENCOUNTER — Encounter: Payer: Self-pay | Admitting: Family Medicine

## 2012-09-30 ENCOUNTER — Telehealth: Payer: Self-pay | Admitting: Family Medicine

## 2012-09-30 MED ORDER — BUPROPION HCL ER (XL) 300 MG PO TB24: 300.0000 mg | ORAL_TABLET | Freq: Every day | ORAL | Status: DC

## 2012-09-30 NOTE — Telephone Encounter (Signed)
I sent script e-scribe and left a message. 

## 2012-09-30 NOTE — Telephone Encounter (Signed)
Increase the Wellbutrin XL to 300 mg daily. Call in one year supply

## 2012-09-30 NOTE — Telephone Encounter (Signed)
Pt had third heart attack on 09-23-12.Pt had 3 blocked arteries and they put in 2 new stents and had to repair the older stents from previous heart attacks. Pt was discharged from [facility] in [facility]. Pt wife is requesting a higher dosage of wellbutrin. [address] in [place]

## 2012-12-16 ENCOUNTER — Telehealth: Payer: Self-pay | Admitting: Family Medicine

## 2012-12-16 NOTE — Telephone Encounter (Signed)
Refill request for Alprazolam 0.5 mg take 1 po tid prn and send to [facility] pharmacy.

## 2012-12-16 NOTE — Telephone Encounter (Signed)
Call in #90 with 5 rf 

## 2012-12-19 MED ORDER — ALPRAZOLAM 0.5 MG PO TABS: 0.5000 mg | ORAL_TABLET | Freq: Three times a day (TID) | ORAL | Status: DC | PRN

## 2012-12-19 NOTE — Telephone Encounter (Signed)
I called in script 

## 2013-01-25 ENCOUNTER — Telehealth: Payer: Self-pay | Admitting: Family Medicine

## 2013-01-25 MED ORDER — ATORVASTATIN CALCIUM 20 MG PO TABS: 20.0000 mg | ORAL_TABLET | Freq: Every day | ORAL | Status: DC

## 2013-01-25 NOTE — Telephone Encounter (Signed)
Refill request for Atorvastatin 20 mg and a 90 day supply and I did send script e-scribe.

## 2013-02-24 ENCOUNTER — Telehealth: Payer: Self-pay | Admitting: Family Medicine

## 2013-02-24 NOTE — Telephone Encounter (Signed)
Refill request for Amlodipine qd, HCTZ 12.5 mg qd and send to [facility].

## 2013-02-27 MED ORDER — AMLODIPINE BESYLATE 5 MG PO TABS: 5.0000 mg | ORAL_TABLET | Freq: Every day | ORAL | Status: DC

## 2013-02-27 MED ORDER — HYDROCHLOROTHIAZIDE 12.5 MG PO CAPS: 12.5000 mg | ORAL_CAPSULE | Freq: Every day | ORAL | Status: DC

## 2013-02-27 NOTE — Telephone Encounter (Signed)
I sent both scripts e-scribe. 

## 2013-04-10 ENCOUNTER — Telehealth: Payer: Self-pay | Admitting: Family Medicine

## 2013-04-10 MED ORDER — BUPROPION HCL ER (XL) 150 MG PO TB24: 150.0000 mg | ORAL_TABLET | Freq: Every day | ORAL | Status: DC

## 2013-04-10 MED ORDER — METOPROLOL TARTRATE 25 MG PO TABS: 25.0000 mg | ORAL_TABLET | Freq: Two times a day (BID) | ORAL | Status: DC

## 2013-04-10 NOTE — Telephone Encounter (Signed)
Pt left a voice message, needs refill on Wellbutrin.

## 2013-04-10 NOTE — Telephone Encounter (Signed)
Refill request for Wellbutrin XL 150 mg take 1 po qd  and also Metoprolol 25 mg take 1 po bid. Per Dr. Clent RidgesFry, okay to send scripts and I did send e-scribe.

## 2013-04-13 NOTE — Telephone Encounter (Signed)
Pt states he made a mistake on the mg amount of the buPROPion (WELLBUTRIN XL) 150 MG 24 hr tablet Pt was told to double up b/c it was not working. Pt did not pu the 150mg .  pls resend to pharm the 300 Mg of  [place]rin [place] [facility] [place]

## 2013-04-14 MED ORDER — BUPROPION HCL ER (XL) 300 MG PO TB24: 300.0000 mg | ORAL_TABLET | Freq: Every day | ORAL | Status: DC

## 2013-04-14 NOTE — Telephone Encounter (Signed)
Change Wellbutrin XL to 300 mg a day and send in a year supply

## 2013-04-14 NOTE — Addendum Note (Signed)
Addended by: Aniceto BossNIMMONS, SYLVIA A on: 04/14/2013 09:36 AM   Modules accepted: Orders

## 2013-04-14 NOTE — Telephone Encounter (Signed)
I called and did cancel the Wellbutrin XL 150mg  and send e-scribe the new script. I also spoke with pt.

## 2013-06-20 ENCOUNTER — Telehealth: Payer: Self-pay | Admitting: Family Medicine

## 2013-06-20 NOTE — Telephone Encounter (Signed)
[facility] - [place], [place] - [address] requesting re-fill on  ALPRAZolam (XANAX) 0.5 MG tablet

## 2013-06-21 MED ORDER — ALPRAZOLAM 0.5 MG PO TABS: 0.5000 mg | ORAL_TABLET | Freq: Three times a day (TID) | ORAL | Status: DC | PRN

## 2013-06-21 NOTE — Telephone Encounter (Signed)
Call in #90 only. He needs testing and a contract  

## 2013-06-21 NOTE — Telephone Encounter (Signed)
I called in script and spoke with pt. I went over the below information about testing & contract. Pt stated that he lives 2 hours away and only visits the office when he is sick. I did explain the new policy to him. I advised that normally we see patient's at least once a year for a check up and maybe he could take care of this at that time.

## 2013-07-26 ENCOUNTER — Telehealth: Payer: Self-pay | Admitting: Family Medicine

## 2013-07-26 MED ORDER — METOPROLOL TARTRATE 25 MG PO TABS: 25.0000 mg | ORAL_TABLET | Freq: Two times a day (BID) | ORAL | Status: DC

## 2013-07-26 NOTE — Telephone Encounter (Signed)
Patient last seen 08/2012.  Sent in a 30 day supply.  Pt needs to be scheduled with Dr. Clent RidgesFry. Please make appt.  Thanks!

## 2013-07-26 NOTE — Telephone Encounter (Signed)
[facility] - [place], [place] - [address] requesting re-fil on metoprolol tartrate (LOPRESSOR) 25 MG tablet

## 2013-07-26 NOTE — Telephone Encounter (Signed)
LMOM for pt to schedule OV with MD.

## 2013-09-11 ENCOUNTER — Telehealth: Payer: Self-pay | Admitting: Family Medicine

## 2013-09-11 NOTE — Telephone Encounter (Signed)
[facility] - [place], [place] - [address] is requesting re-fill on atorvastatin (LIPITOR) 20 MG tablet

## 2013-09-12 MED ORDER — ATORVASTATIN CALCIUM 20 MG PO TABS: 20.0000 mg | ORAL_TABLET | Freq: Every day | ORAL | Status: DC

## 2013-09-12 NOTE — Telephone Encounter (Signed)
I sent in a script and left a detailed message, for pt to schedule a office visit before we can give any more refills.

## 2013-09-27 ENCOUNTER — Telehealth: Payer: Self-pay | Admitting: Family Medicine

## 2013-09-27 NOTE — Telephone Encounter (Signed)
[facility] - [place], [place] - [address] is requesting re-fill amLODipine (NORVASC) 5 MG tablet

## 2013-09-28 MED ORDER — AMLODIPINE BESYLATE 5 MG PO TABS: 5.0000 mg | ORAL_TABLET | Freq: Every day | ORAL | Status: DC

## 2013-09-28 NOTE — Telephone Encounter (Signed)
I sent script e-scribe. 

## 2013-11-07 ENCOUNTER — Telehealth: Payer: Self-pay | Admitting: Family Medicine

## 2013-11-07 NOTE — Telephone Encounter (Signed)
Please add amLODipine (NORVASC) 5 MG tablet to the re-fill request below

## 2013-11-07 NOTE — Telephone Encounter (Signed)
[facility] - [place], [place] - [address] is requesting re-fill on metoprolol tartrate (LOPRESSOR) 25 MG tablet

## 2013-11-08 MED ORDER — METOPROLOL TARTRATE 25 MG PO TABS: 25.0000 mg | ORAL_TABLET | Freq: Two times a day (BID) | ORAL | Status: DC

## 2013-11-08 MED ORDER — AMLODIPINE BESYLATE 5 MG PO TABS: 5.0000 mg | ORAL_TABLET | Freq: Every day | ORAL | Status: DC

## 2013-11-08 NOTE — Telephone Encounter (Signed)
I sent both scripts e-scribe. 

## 2013-11-14 ENCOUNTER — Emergency Department (HOSPITAL_BASED_OUTPATIENT_CLINIC_OR_DEPARTMENT_OTHER)
Admission: EM | Admit: 2013-11-14 | Discharge: 2013-11-14 | Disposition: A | Discharge status: Home / Self Care | Payer: 59 | Attending: Emergency Medicine | Admitting: Emergency Medicine

## 2013-11-14 ENCOUNTER — Emergency Department (HOSPITAL_BASED_OUTPATIENT_CLINIC_OR_DEPARTMENT_OTHER): Payer: 59

## 2013-11-14 ENCOUNTER — Encounter (HOSPITAL_BASED_OUTPATIENT_CLINIC_OR_DEPARTMENT_OTHER): Payer: Self-pay | Admitting: Emergency Medicine

## 2013-11-14 DIAGNOSIS — Z9861 Coronary angioplasty status: Secondary | ICD-10-CM | POA: Insufficient documentation

## 2013-11-14 DIAGNOSIS — E785 Hyperlipidemia, unspecified: Secondary | ICD-10-CM | POA: Diagnosis not present

## 2013-11-14 DIAGNOSIS — I252 Old myocardial infarction: Secondary | ICD-10-CM | POA: Insufficient documentation

## 2013-11-14 DIAGNOSIS — Z7982 Long term (current) use of aspirin: Secondary | ICD-10-CM | POA: Diagnosis not present

## 2013-11-14 DIAGNOSIS — Y929 Unspecified place or not applicable: Secondary | ICD-10-CM | POA: Insufficient documentation

## 2013-11-14 DIAGNOSIS — I10 Essential (primary) hypertension: Secondary | ICD-10-CM | POA: Insufficient documentation

## 2013-11-14 DIAGNOSIS — W298XXA Contact with other powered powered hand tools and household machinery, initial encounter: Secondary | ICD-10-CM | POA: Diagnosis not present

## 2013-11-14 DIAGNOSIS — S61209A Unspecified open wound of unspecified finger without damage to nail, initial encounter: Secondary | ICD-10-CM | POA: Insufficient documentation

## 2013-11-14 DIAGNOSIS — Z87891 Personal history of nicotine dependence: Secondary | ICD-10-CM | POA: Insufficient documentation

## 2013-11-14 DIAGNOSIS — Z8719 Personal history of other diseases of the digestive system: Secondary | ICD-10-CM | POA: Insufficient documentation

## 2013-11-14 DIAGNOSIS — S6980XA Other specified injuries of unspecified wrist, hand and finger(s), initial encounter: Secondary | ICD-10-CM | POA: Insufficient documentation

## 2013-11-14 DIAGNOSIS — Z7902 Long term (current) use of antithrombotics/antiplatelets: Secondary | ICD-10-CM | POA: Insufficient documentation

## 2013-11-14 DIAGNOSIS — S6990XA Unspecified injury of unspecified wrist, hand and finger(s), initial encounter: Secondary | ICD-10-CM | POA: Insufficient documentation

## 2013-11-14 DIAGNOSIS — S61219A Laceration without foreign body of unspecified finger without damage to nail, initial encounter: Secondary | ICD-10-CM

## 2013-11-14 DIAGNOSIS — Z79899 Other long term (current) drug therapy: Secondary | ICD-10-CM | POA: Diagnosis not present

## 2013-11-14 DIAGNOSIS — Z9889 Other specified postprocedural states: Secondary | ICD-10-CM | POA: Diagnosis not present

## 2013-11-14 DIAGNOSIS — I2510 Atherosclerotic heart disease of native coronary artery without angina pectoris: Secondary | ICD-10-CM | POA: Insufficient documentation

## 2013-11-14 DIAGNOSIS — Y939 Activity, unspecified: Secondary | ICD-10-CM | POA: Insufficient documentation

## 2013-11-14 HISTORY — DX: Acute myocardial infarction, unspecified: I21.9

## 2013-11-14 MED ORDER — CEPHALEXIN 500 MG PO CAPS
500.0000 mg | ORAL_CAPSULE | Freq: Four times a day (QID) | ORAL | Status: DC
Start: 2013-11-14 — End: 2014-01-03

## 2013-11-14 MED ORDER — OXYCODONE-ACETAMINOPHEN 5-325 MG PO TABS: 2.0000 | ORAL_TABLET | ORAL | Status: DC | PRN

## 2013-11-14 NOTE — ED Notes (Addendum)
Cut left index finger on saw approx 1-1.5 hrs PTA-lac noted to knuckle-dsg applied-bleed thru noted-redressed

## 2013-11-14 NOTE — Discharge Instructions (Signed)

## 2013-11-14 NOTE — ED Provider Notes (Signed)
CSN: [id number]     Arrival date & time 11/14/13  1401 History   None    Chief Complaint  Patient presents with  . Finger Injury     (Consider location/radiation/quality/duration/timing/severity/associated sxs/prior Treatment) Patient is a 53 y.o. male presenting with skin laceration. The history is provided by the patient. No language interpreter was used.  Laceration Location:  Finger Finger laceration location:  L ring finger Length (cm):  2 Depth:  Through underlying tissue Quality: avulsion and jagged   Bleeding: controlled   Time since incident:  2 hours Laceration mechanism:  Metal edge Pain details:    Quality:  Aching   Severity:  No pain   Timing:  Constant Foreign body present:  No foreign bodies Relieved by:  Nothing Worsened by:  Nothing tried Ineffective treatments:  None tried Tetanus status:  Unknown Pt cut finger with a miter saw  Past Medical History  Diagnosis Date  . CAD (coronary artery disease)     Non-STEMI January 02, 2011, DES to OM, residual chronically occluded small RCA with collaterals  . Ejection fraction     EF 40% at the time of acute MI /  EF 55%, January 19, 2011, outpatient echo, no focal wall motion abnormalities.  . Hypertension   . Dyslipidemia   . Alcohol use     Regular alcohol use, to cut down   . Hiatal hernia     history  . GERD (gastroesophageal reflux disease)   . Hyperlipidemia   . MI (myocardial infarction)    Past Surgical History  Procedure Laterality Date  . Cardiac catheterization  01/02/2011  . Other surgical history      intestinal repair surgery after traumatic injury at the age of 73  . Coronary angioplasty with stent placement     Family History  Problem Relation Age of Onset  . Hypertension Mother   . Arthritis Mother   . Tremor Mother   . Alcohol abuse Father     had heart surgery, family is estranged from father  . Hypertension Brother    History  Substance Use Topics  . Smoking status: Former  Games developer  . Smokeless tobacco: Never Used  . Alcohol Use: Yes    Review of Systems  All other systems reviewed and are negative.     Allergies  Codeine and Hydrocodone  Home Medications   Prior to Admission medications   Medication Sig Start Date End Date Taking? Authorizing Provider  Prasugrel HCl (EFFIENT PO) Take by mouth.   Yes Historical Provider, MD  ALPRAZolam Prudy Feeler) 0.5 MG tablet Take 1 tablet (0.5 mg total) by mouth 3 (three) times daily as needed for anxiety. 06/21/13   Nelwyn Salisbury, MD  amLODipine (NORVASC) 5 MG tablet Take 1 tablet (5 mg total) by mouth daily. 11/08/13   Nelwyn Salisbury, MD  aspirin 81 MG tablet Take 81 mg by mouth daily.      Historical Provider, MD  atorvastatin (LIPITOR) 20 MG tablet Take 1 tablet (20 mg total) by mouth at bedtime. 09/12/13   Nelwyn Salisbury, MD  buPROPion (WELLBUTRIN XL) 300 MG 24 hr tablet Take 1 tablet (300 mg total) by mouth daily. 04/14/13   Nelwyn Salisbury, MD  erythromycin ophthalmic ointment Place into both eyes every 6 (six) hours. 08/23/12   Nelwyn Salisbury, MD  hydrochlorothiazide (MICROZIDE) 12.5 MG capsule Take 1 capsule (12.5 mg total) by mouth daily. 02/27/13   Nelwyn Salisbury, MD  metoprolol tartrate (LOPRESSOR)  25 MG tablet Take 1 tablet (25 mg total) by mouth 2 (two) times daily. 11/08/13   Nelwyn SalisburyStephen A Fry, MD  nitroGLYCERIN (NITROSTAT) 0.4 MG SL tablet Place 1 tablet (0.4 mg total) under the tongue every 5 (five) minutes as needed. 03/01/12   Luis AbedJeffrey D Katz, MD   BP 139/86  Pulse 64  Temp(Src) 97.9 F (36.6 C) (Oral)  Resp 18  Ht 5\' 10"  (1.778 m)  Wt 225 lb (102.059 kg)  BMI 32.28 kg/m2  SpO2 98% Physical Exam  Nursing note and vitals reviewed. Constitutional: He is oriented to person, place, and time. He appears well-developed and well-nourished.  HENT:  Head: Normocephalic and atraumatic.  Musculoskeletal: He exhibits tenderness.  Neurological: He is alert and oriented to person, place, and time.  Skin: Skin is warm.  2cm  laceration left 2nd finger gapping  Psychiatric: He has a normal mood and affect.    ED Course  LACERATION REPAIR Date/Time: 11/14/2013 11:04 PM Performed by: Elson AreasSOFIA, Sierria Bruney K Authorized by: Elson AreasSOFIA, Riva Sesma K Consent: Verbal consent not obtained. Risks and benefits: risks, benefits and alternatives were discussed Consent given by: patient Patient understanding: patient states understanding of the procedure being performed Patient identity confirmed: verbally with patient Laceration length: 2 cm Foreign bodies: no foreign bodies Tendon involvement: superficial Nerve involvement: none Vascular damage: no Anesthesia: local infiltration Local anesthetic: lidocaine 2% without epinephrine Patient sedated: no Preparation: Patient was prepped and draped in the usual sterile fashion. Irrigation solution: saline Amount of cleaning: standard Debridement: moderate Skin closure: 5-0 Prolene Number of sutures: 6 Technique: simple Approximation: loose Approximation difficulty: simple Patient tolerance: Patient tolerated the procedure well with no immediate complications. Comments: Avulsion flap laceration,  Sheath of tendon shaved  From,  Tendon visualized and moving   (including critical care time) Labs Review Labs Reviewed - No data to display  Imaging Review Dg Finger Index Left  11/14/2013   CLINICAL DATA:  Pain post trauma  EXAM: LEFT SECOND FINGER 2+V  COMPARISON:  None.  FINDINGS: Frontal, oblique, and lateral views were obtained. There is a bandage overlying the dorsal aspect of the PIP joint region. No radiopaque foreign body beyond bandage seen. No fracture or dislocation. Joint spaces appear intact. No erosive change.  IMPRESSION: Bandage located over the dorsal PIP joint. No other radiopaque foreign body. No bony abnormality. In particular, no fracture or dislocation.   Electronically Signed   By: Bretta BangWilliam  Woodruff M.D.   On: 11/14/2013 14:31     EKG Interpretation None       MDM   Final diagnoses:  Laceration of finger of left hand, initial encounter    I spoke to Dr. Janee Mornhompson who advised have pt call office for evaluation    Elson AreasLeslie K Toree Edling, PA-C 11/14/13 2306  Lonia SkinnerLeslie K Little YorkSofia, PA-C 11/14/13 2309

## 2013-11-14 NOTE — ED Notes (Signed)
Suture cart and supplies gathered and placed at bedside for PA.

## 2013-11-14 NOTE — ED Notes (Signed)
Pt angry, states "We have been here for a damn hour waiting and I'm ready to walk out the damn door! Explained to pt and wife that medical staff are caring for a critical patient in the trauma bay at this moment, but I will ask them to come in as soon as I can. Pt states "this is bullshit."!

## 2013-11-14 NOTE — ED Notes (Signed)
Ernest Bishop at bedside  

## 2013-11-14 NOTE — ED Notes (Signed)
Langston MaskerKaren Sofia notified of pt. Concerns.

## 2013-11-14 NOTE — ED Provider Notes (Signed)
Medical screening examination/treatment/procedure(s) were performed by non-physician practitioner and as supervising physician I was immediately available for consultation/collaboration.   EKG Interpretation None       Lis Savitt K Linker, MD 11/14/13 2314 

## 2013-12-18 ENCOUNTER — Telehealth: Payer: Self-pay | Admitting: Family Medicine

## 2013-12-18 NOTE — Telephone Encounter (Signed)
I do not prescribe Effient. This would need to come from his cardiologist, Dr. Myrtis Ser. Call in #30 of the Losartan HCT but he needs an OV for anything else

## 2013-12-18 NOTE — Telephone Encounter (Signed)
Please add LOSARTAN POTASSIUM 50 MG #30  To the below re-fill request

## 2013-12-18 NOTE — Telephone Encounter (Signed)
[facility] - [place], [place] - [address] is requesting re-fill on Prasugrel HCl (EFFIENT PO)

## 2013-12-20 ENCOUNTER — Telehealth: Payer: Self-pay | Admitting: *Deleted

## 2013-12-20 MED ORDER — LOSARTAN POTASSIUM 50 MG PO TABS: 50.0000 mg | ORAL_TABLET | Freq: Every day | ORAL | Status: DC

## 2013-12-20 NOTE — Telephone Encounter (Signed)
2nd re-fill request was received on the Losartan

## 2013-12-20 NOTE — Telephone Encounter (Signed)
Patient called and stated that he needs a refill of effient. Dr Myrtis Ser last saw him in 2013 and took him off of this, but a few weeks later he had a heart attack. He stated that he actually had two. He was seen at the local hospital where he is living now and was put back on the effient and has been doing fine since. He lives two hours away, but is willing to come back for a visit with Dr Myrtis Ser if that is required, which I advised him that he does indeed need to be seen and transferred him to scheduling to set that up. Please advise on effient refill. Thanks, MI

## 2013-12-20 NOTE — Telephone Encounter (Signed)
**Note De-Identified Porche Steinberger Obfuscation** The pt is advised to contact the MD who prescribed effient to him as Dr Myrtis Ser d/c'd that medication in November of 2013. He verbalized understanding. He is scheduled to see Dr Myrtis Ser on 01/03/14.

## 2013-12-20 NOTE — Telephone Encounter (Signed)
I sent script e-scribe for Losartan, left a voice message for pt with below information and spoke with pharmacy.

## 2014-01-03 ENCOUNTER — Encounter: Payer: Self-pay | Admitting: Cardiology

## 2014-01-03 ENCOUNTER — Ambulatory Visit (INDEPENDENT_AMBULATORY_CARE_PROVIDER_SITE_OTHER): Payer: 59 | Admitting: Cardiology

## 2014-01-03 VITALS — BP 126/82 | HR 54 | Ht 70.0 in | Wt 234.8 lb

## 2014-01-03 DIAGNOSIS — I10 Essential (primary) hypertension: Secondary | ICD-10-CM

## 2014-01-03 DIAGNOSIS — E785 Hyperlipidemia, unspecified: Secondary | ICD-10-CM

## 2014-01-03 DIAGNOSIS — I2510 Atherosclerotic heart disease of native coronary artery without angina pectoris: Secondary | ICD-10-CM

## 2014-01-03 MED ORDER — METOPROLOL TARTRATE 25 MG PO TABS: 25.0000 mg | ORAL_TABLET | Freq: Two times a day (BID) | ORAL | Status: DC

## 2014-01-03 MED ORDER — PRASUGREL HCL 10 MG PO TABS: 10.0000 mg | ORAL_TABLET | Freq: Every day | ORAL | Status: DC

## 2014-01-03 MED ORDER — LOSARTAN POTASSIUM 50 MG PO TABS: 50.0000 mg | ORAL_TABLET | Freq: Every day | ORAL | Status: DC

## 2014-01-03 MED ORDER — ATORVASTATIN CALCIUM 40 MG PO TABS: 40.0000 mg | ORAL_TABLET | Freq: Every day | ORAL | Status: DC

## 2014-01-03 NOTE — Assessment & Plan Note (Signed)
Blood pressures control. No change in therapy. 

## 2014-01-03 NOTE — Patient Instructions (Addendum)
Your physician has recommended you make the following change in your medication: increase atorvastatin to 40 mg at bedtime  Your physician has requested that you have an echocardiogram. Echocardiography is a painless test that uses sound waves to create images of your heart. It provides your doctor with information about the size and shape of your heart and how well your heart's chambers and valves are working. This procedure takes approximately one hour. There are no restrictions for this procedure.   Your physician wants you to follow-up in: 6 months. You will receive a reminder letter in the mail two months in advance. If you don't receive a letter, please call our office to schedule the follow-up appointment.

## 2014-01-03 NOTE — Progress Notes (Signed)
Patient ID: Ernest PolingKevin L Zellars, male   DOB: 10/29/1960, 53 y.o.   MRN: [id number]     HPI  Patient is seen today to followup coronary artery disease. I saw him last November, 2013. In June, 2014 the patient had the same recurring chest discomfort and went to the hospital in [facility]. I do not have all the records. I do have the specifics of the stents that were placed. He was taken immediately to the cath lab. He drug-eluting stents to OM1 and the distal circumflex. He's done very well since then. He is on dual antiplatelet therapy and this will be continued indefinitely. He knows his symptom well. He has had no recurrence since June, 2014.  As part of today's evaluation I have reviewed the records that the patient carries and the card from his catheterization.    Allergies  Allergen Reactions  . Codeine Itching  . Hydrocodone Itching    Current Outpatient Prescriptions  Medication Sig Dispense Refill  . amLODipine (NORVASC) 5 MG tablet Take 1 tablet (5 mg total) by mouth daily.  30 tablet  0  . aspirin 81 MG tablet Take 81 mg by mouth daily.        Marland Kitchen. atorvastatin (LIPITOR) 20 MG tablet Take 1 tablet (20 mg total) by mouth at bedtime.  90 tablet  0  . hydrochlorothiazide (MICROZIDE) 12.5 MG capsule Take 1 capsule (12.5 mg total) by mouth daily.  30 capsule  5  . losartan (COZAAR) 50 MG tablet Take 1 tablet (50 mg total) by mouth daily.  30 tablet  0  . metoprolol tartrate (LOPRESSOR) 25 MG tablet Take 1 tablet (25 mg total) by mouth 2 (two) times daily.  60 tablet  0  . nitroGLYCERIN (NITROSTAT) 0.4 MG SL tablet Place 1 tablet (0.4 mg total) under the tongue every 5 (five) minutes as needed.  25 tablet  11  . Prasugrel HCl (EFFIENT PO) Take by mouth.       No current facility-administered medications for this visit.    History   Social History  . Marital Status: Married    Spouse Name: N/A    Number of Children: 1  . Years of Education: N/A   Occupational History    . flooring    Social History Main Topics  . Smoking status: Former Games developermoker  . Smokeless tobacco: Never Used  . Alcohol Use: Yes  . Drug Use: No  . Sexual Activity: Not on file   Other Topics Concern  . Not on file   Social History Narrative  . No narrative on file    Family History  Problem Relation Age of Onset  . Hypertension Mother   . Arthritis Mother   . Tremor Mother   . Alcohol abuse Father     had heart surgery, family is estranged from father  . Hypertension Brother     Past Medical History  Diagnosis Date  . CAD (coronary artery disease)     Non-STEMI January 02, 2011, DES to OM, residual chronically occluded small RCA with collaterals  . Ejection fraction     EF 40% at the time of acute MI /  EF 55%, January 19, 2011, outpatient echo, no focal wall motion abnormalities.  . Hypertension   . Dyslipidemia   . Alcohol use     Regular alcohol use, to cut down   . Hiatal hernia     history  . GERD (gastroesophageal reflux disease)   . Hyperlipidemia   .  MI (myocardial infarction)     Past Surgical History  Procedure Laterality Date  . Cardiac catheterization  01/02/2011  . Other surgical history      intestinal repair surgery after traumatic injury at the age of 75  . Coronary angioplasty with stent placement      Patient Active Problem List   Diagnosis Date Noted  . Ejection fraction   . CAD (coronary artery disease)   . Hypertension   . Dyslipidemia   . Alcohol use     ROS   Patient denies fever, chills, headache, sweats, rash, change in vision, change in hearing, chest pain, cough, nausea vomiting, urinary symptoms. All other systems are reviewed and are negative.  PHYSICAL EXAM  Patient is here with his wife. He is oriented to person time and place. Affect is normal. Head is atraumatic. Sclera and conjunctiva are normal. Lungs are clear. Respiratory effort is nonlabored. There is no jugulovenous distention. Cardiac exam reveals S1 and S2. The  abdomen is soft. There is no peripheral edema. There no musculoskeletal deformities. There are no skin rashes.  Filed Vitals:   01/03/14 1109  BP: 126/82  Pulse: 54  Height: 5\' 10"  (1.778 m)  Weight: 234 lb 12.8 oz (106.505 kg)    EKG is done today and reviewed by me. There is sinus rhythm with mild sinus bradycardia. The QRS is normal. ASSESSMENT & PLAN

## 2014-01-03 NOTE — Assessment & Plan Note (Signed)
The patient's atorvastatin needs to be increased from 20-40 mg daily so that he is on guideline directed therapy. This will be done.

## 2014-01-03 NOTE — Assessment & Plan Note (Signed)
Since I saw him last he has had further coronary interventions. I have gathered the information to the best of my ability at this time. He did received 2 drug-eluting stents. When I had seen him in 2013 we have decided to stop the second drug of his dual antiplatelet therapy. He is now on dual antiplatelet therapy and we will plan to continue this indefinitely. He's feeling well. He's not having any significant chest pain. Two-dimensional echo will be done to assess his LV function. We do not have information about his LV function since his acute event in June, 2014. I will then see him back in 6 months in followup.  As part of today's evaluation I spent greater than 25 minutes with is total care. More than half of this time is been with direct contact with the patient and his wife reviewing all of the history from other medical centers and his overall status.

## 2014-01-09 ENCOUNTER — Telehealth: Payer: Self-pay | Admitting: Cardiology

## 2014-01-09 ENCOUNTER — Other Ambulatory Visit (HOSPITAL_COMMUNITY): Payer: 59

## 2014-01-17 ENCOUNTER — Other Ambulatory Visit: Payer: Self-pay | Admitting: *Deleted

## 2014-01-17 MED ORDER — AMLODIPINE BESYLATE 5 MG PO TABS: 5.0000 mg | ORAL_TABLET | Freq: Every day | ORAL | Status: DC

## 2014-01-19 ENCOUNTER — Other Ambulatory Visit (HOSPITAL_COMMUNITY): Payer: 59

## 2014-01-30 NOTE — Telephone Encounter (Signed)
Notified nurse

## 2014-02-13 ENCOUNTER — Other Ambulatory Visit: Payer: Self-pay | Admitting: Cardiology

## 2014-02-13 MED ORDER — HYDROCHLOROTHIAZIDE 12.5 MG PO CAPS: 12.5000 mg | ORAL_CAPSULE | Freq: Every day | ORAL | Status: DC

## 2014-07-06 ENCOUNTER — Telehealth: Payer: Self-pay | Admitting: Cardiology

## 2014-07-06 NOTE — Telephone Encounter (Signed)
New message        Pt wants to know if he really needs to come into the office on money due to him not having insurance   He does not want to make more charges if he does not have to

## 2014-07-06 NOTE — Telephone Encounter (Signed)
Calling stating he has an appointment with Dr. Myrtis SerKatz on Monday 07/09/14 at 11:00.  States he does not have insurance and is self pay.  Wanted to know if he has to keep appointment.  Advised would be his call but with his history would be recommended that he keep appointment. Spoke w/Rhonda in billing who states that his charge Monday would be approximately $238 but if he is self pay and can pay within 60 days then the charge would be 1/2- approx $120.  Pt states he nor his wife work and they have been living off savings and retirement.  [facility]  States he didn't have the echo last fall because he couldn't afford it.  Advised per Bjorn Loserhonda that if he can pay within 60 days of his appointment would be 1/2 the charge.  Dr. Myrtis SerKatz would have to close encounter after visit and changes could be calculated.  Pt states if he can pay 1/2 then he would pay at Monday visit.  He will keep appointment.

## 2014-07-09 ENCOUNTER — Telehealth: Payer: Self-pay | Admitting: Cardiology

## 2014-07-09 ENCOUNTER — Encounter: Payer: Self-pay | Admitting: Cardiology

## 2014-07-09 ENCOUNTER — Ambulatory Visit: Payer: Self-pay | Admitting: Cardiology

## 2014-07-09 NOTE — Progress Notes (Unsigned)
The patient was scheduled for an office visit today. He called to say that he could not afford this. We made it clear we would do everything we can to try to help reduce his costs. Over time I'm hopeful that he will continue to follow-up with some of his physicians. We will help with his medications at this time.  Ernest BonitoJeff Everado Pillsbury, MD

## 2014-07-09 NOTE — Telephone Encounter (Signed)
New Message     Patient needs to speak to nurse in regards to his appts and if he needs to come to an appt because he has money issues.

## 2014-07-09 NOTE — Telephone Encounter (Signed)
**Note De-Identified Ernest Bishop Obfuscation** The pt is advised per Dr Myrtis SerKatz that Dr Myrtis SerKatz will review his chart and determine if and when the pt will need to f/u. Per Dr Myrtis SerKatz we have canceled his appt for today and the pt is aware.

## 2014-08-21 ENCOUNTER — Other Ambulatory Visit: Payer: Self-pay

## 2014-08-21 MED ORDER — METOPROLOL TARTRATE 25 MG PO TABS
25.0000 mg | ORAL_TABLET | Freq: Two times a day (BID) | ORAL | Status: DC
Start: 2014-08-21 — End: 2015-09-10

## 2014-09-11 ENCOUNTER — Other Ambulatory Visit: Payer: Self-pay

## 2014-09-11 MED ORDER — PRASUGREL HCL 10 MG PO TABS: 10.0000 mg | ORAL_TABLET | Freq: Every day | ORAL | Status: DC

## 2014-10-19 ENCOUNTER — Other Ambulatory Visit: Payer: Self-pay

## 2014-10-19 MED ORDER — ATORVASTATIN CALCIUM 40 MG PO TABS: 40.0000 mg | ORAL_TABLET | Freq: Every day | ORAL | Status: DC

## 2014-10-19 MED ORDER — AMLODIPINE BESYLATE 5 MG PO TABS: 5.0000 mg | ORAL_TABLET | Freq: Every day | ORAL | Status: DC

## 2014-11-13 ENCOUNTER — Other Ambulatory Visit: Payer: Self-pay

## 2014-11-13 MED ORDER — LOSARTAN POTASSIUM 50 MG PO TABS: 50.0000 mg | ORAL_TABLET | Freq: Every day | ORAL | Status: DC

## 2014-11-13 MED ORDER — HYDROCHLOROTHIAZIDE 12.5 MG PO CAPS: 12.5000 mg | ORAL_CAPSULE | Freq: Every day | ORAL | Status: DC

## 2014-11-13 MED ORDER — PRASUGREL HCL 10 MG PO TABS: 10.0000 mg | ORAL_TABLET | Freq: Every day | ORAL | Status: DC

## 2014-12-18 ENCOUNTER — Other Ambulatory Visit: Payer: Self-pay

## 2014-12-18 MED ORDER — AMLODIPINE BESYLATE 5 MG PO TABS: 5.0000 mg | ORAL_TABLET | Freq: Every day | ORAL | Status: DC

## 2014-12-18 NOTE — Telephone Encounter (Signed)
Luis Abed, MD at 01/03/2014 11:57 AM  amLODipine (NORVASC) 5 MG tabletTake 1 tablet (5 mg total) by mouth daily Patient Instructions     Your physician has recommended you make the following change in your medication: increase atorvastatin to 40 mg at bedtime

## 2014-12-31 ENCOUNTER — Ambulatory Visit: Payer: Self-pay | Admitting: Cardiology

## 2015-01-11 ENCOUNTER — Other Ambulatory Visit: Payer: Self-pay

## 2015-01-11 MED ORDER — ATORVASTATIN CALCIUM 40 MG PO TABS: 40.0000 mg | ORAL_TABLET | Freq: Every day | ORAL | Status: DC

## 2015-01-21 ENCOUNTER — Encounter: Payer: Self-pay | Admitting: Cardiology

## 2015-01-21 ENCOUNTER — Ambulatory Visit (INDEPENDENT_AMBULATORY_CARE_PROVIDER_SITE_OTHER): Payer: Self-pay | Admitting: Cardiology

## 2015-01-21 VITALS — BP 128/64 | HR 49 | Ht 70.0 in | Wt 254.0 lb

## 2015-01-21 DIAGNOSIS — I2510 Atherosclerotic heart disease of native coronary artery without angina pectoris: Secondary | ICD-10-CM

## 2015-01-21 DIAGNOSIS — I2583 Coronary atherosclerosis due to lipid rich plaque: Secondary | ICD-10-CM

## 2015-01-21 DIAGNOSIS — E785 Hyperlipidemia, unspecified: Secondary | ICD-10-CM

## 2015-01-21 DIAGNOSIS — I10 Essential (primary) hypertension: Secondary | ICD-10-CM

## 2015-01-21 NOTE — Progress Notes (Signed)
Patient ID: Ernest Bishop, male   DOB: 08-26-1960, 54 y.o.   MRN: [id number]     HPI  Patient is seen today to followup coronary artery disease. I saw him last November, 2013. In June, 2014 the patient had the same recurring chest discomfort and went to the hospital in [facility]. I do not have all the records. I do have the specifics of the stents that were placed. He was taken immediately to the cath lab. He drug-eluting stents to OM1 and the distal circumflex. He's done very well since then. He is on dual antiplatelet therapy and this will be continued indefinitely. He knows his symptom well. He has had no recurrence since June, 2014. He states that prior to that he received 2 other stents in the past. His presentation is usually heartburn like pains, jaw and left ram pain. No pain in the last two years, he has been complaint to his meds, no side effects. He doesn't have PCP and his lipids were not checked in the last 2 years.   Allergies  Allergen Reactions  . Codeine Itching  . Hydrocodone Itching    Current Outpatient Prescriptions  Medication Sig Dispense Refill  . amLODipine (NORVASC) 5 MG tablet Take 1 tablet (5 mg total) by mouth daily. 30 tablet 0  . aspirin 81 MG tablet Take 81 mg by mouth daily.      Marland Kitchen atorvastatin (LIPITOR) 40 MG tablet Take 1 tablet (40 mg total) by mouth at bedtime. 30 tablet 1  . hydrochlorothiazide (MICROZIDE) 12.5 MG capsule Take 1 capsule (12.5 mg total) by mouth daily. 30 capsule 1  . losartan (COZAAR) 50 MG tablet Take 1 tablet (50 mg total) by mouth daily. 30 tablet 1  . metoprolol tartrate (LOPRESSOR) 25 MG tablet Take 1 tablet (25 mg total) by mouth 2 (two) times daily. 180 tablet 1  . nitroGLYCERIN (NITROSTAT) 0.4 MG SL tablet Place 1 tablet (0.4 mg total) under the tongue every 5 (five) minutes as needed. 25 tablet 11  . prasugrel (EFFIENT) 10 MG TABS tablet Take 1 tablet (10 mg total) by mouth daily. 30 tablet 1   No current  facility-administered medications for this visit.    Social History   Social History  . Marital Status: Married    Spouse Name: N/A  . Number of Children: 1  . Years of Education: N/A   Occupational History  . flooring    Social History Main Topics  . Smoking status: Former Games developer  . Smokeless tobacco: Never Used  . Alcohol Use: Yes  . Drug Use: No  . Sexual Activity: Not on file   Other Topics Concern  . Not on file   Social History Narrative    Family History  Problem Relation Age of Onset  . Hypertension Mother   . Arthritis Mother   . Tremor Mother   . Alcohol abuse Father     had heart surgery, family is estranged from father  . Hypertension Brother     Past Medical History  Diagnosis Date  . CAD (coronary artery disease)     Non-STEMI January 02, 2011, DES to OM, residual chronically occluded small RCA with collaterals  . Ejection fraction     EF 40% at the time of acute MI /  EF 55%, January 19, 2011, outpatient echo, no focal wall motion abnormalities.  . Hypertension   . Dyslipidemia   . Alcohol use (HCC)     Regular alcohol use, to  cut down   . Hiatal hernia     history  . GERD (gastroesophageal reflux disease)   . Hyperlipidemia   . MI (myocardial infarction) [facility](HCC)     Past Surgical History  Procedure Laterality Date  . Cardiac catheterization  01/02/2011  . Other surgical history      intestinal repair surgery after traumatic injury at the age of 89  . Coronary angioplasty with stent placement      Patient Active Problem List   Diagnosis Date Noted  . Ejection fraction   . CAD (coronary artery disease)   . Hypertension   . Dyslipidemia   . Alcohol use (HCC)     ROS   Patient denies fever, chills, headache, sweats, rash, change in vision, change in hearing, chest pain, cough, nausea vomiting, urinary symptoms. All other systems are reviewed and are negative.  PHYSICAL EXAM  Patient is here with his wife. He is oriented to person  time and place. Affect is normal. Head is atraumatic. Sclera and conjunctiva are normal. Lungs are clear. Respiratory effort is nonlabored. There is no jugulovenous distention. Cardiac exam reveals S1 and S2. The abdomen is soft. There is no peripheral edema. There no musculoskeletal deformities. There are no skin rashes.  Filed Vitals:   01/21/15 1333  BP: 128/64  Pulse: 49  Height: 5\' 10"  (1.778 m)  Weight: 254 lb (115.214 kg)    EKG is done today and reviewed by me. There is sinus rhythm with mild sinus bradycardia. The QRS is normal.  TTE: 01/18/2014 - Left ventricle: The cavity size was normal. Wall thickness was increased in a pattern of mild LVH. Systolic function was normal. The estimated ejection fraction was in the range of 50% to 55%. Wall motion was normal; there were no regional wall motion abnormalities. Left ventricular diastolic function parameters were normal. - Left atrium: The atrium was mildly dilated. - Pericardium, extracardiac: A trivial pericardial effusion was identified    ASSESSMENT & PLAN  1. CAD, s/p PCI to 3 vessels in the past, he is asymptomatic, ECG shows only non-specific changes in the inferolateral leads, no ischemic work up right now. We will continue ASA/Effient for another year (given samples), the next year d/c or start Plavix, continue losartan, metoprolol.   2. Hypertension - well controlled.  3. Hyperlipidemia - check lipids and CMP, continue atorvastatin 40 mg po daily  Follow up in 1 year.  Lars MassonELSON, Aristea Posada H 01/21/2015

## 2015-01-21 NOTE — Patient Instructions (Signed)
Medication Instructions:   Your physician recommends that you continue on your current medications as directed. Please refer to the Current Medication list given to you today.    Labwork:  DR NELSON HAS HAND-WRITTEN A PRESCRIPTION FOR YOU TO GO TO A LABORATORY OR YOUR PCP TO HAVE A BMET, LFT, AND LIPIDS CHECKED AND HAVE THE RESULTS FAXED TO OUR OFFICE AT [phone] ATTENTION [phone] Delton SeeNELSON AND NURSE IVY--PLEASE GO FASTING TO THIS LAB APPOINTMENT     Follow-Up:  Your physician wants you to follow-up in: ONE YEAR WITH DR Johnell ComingsNELSON You will receive a reminder letter in the mail two months in advance. If you don't receive a letter, please call our office to schedule the follow-up appointment.

## 2015-01-29 NOTE — Addendum Note (Signed)
Addended by: Reesa ChewJONES, Keeton Kassebaum G on: 01/29/2015 02:17 PM   Modules accepted: Orders

## 2015-02-07 ENCOUNTER — Other Ambulatory Visit: Payer: Self-pay | Admitting: Cardiology

## 2015-02-07 MED ORDER — AMLODIPINE BESYLATE 5 MG PO TABS: 5.0000 mg | ORAL_TABLET | Freq: Every day | ORAL | Status: DC

## 2015-03-06 ENCOUNTER — Other Ambulatory Visit: Payer: Self-pay

## 2015-03-06 MED ORDER — LOSARTAN POTASSIUM 50 MG PO TABS: 50.0000 mg | ORAL_TABLET | Freq: Every day | ORAL | Status: DC

## 2015-03-06 MED ORDER — HYDROCHLOROTHIAZIDE 12.5 MG PO CAPS: 12.5000 mg | ORAL_CAPSULE | Freq: Every day | ORAL | Status: DC

## 2015-06-28 ENCOUNTER — Other Ambulatory Visit: Payer: Self-pay | Admitting: Cardiology

## 2015-09-10 ENCOUNTER — Other Ambulatory Visit: Payer: Self-pay | Admitting: Cardiology

## 2016-01-13 ENCOUNTER — Encounter: Payer: Self-pay | Admitting: Cardiology

## 2016-01-23 ENCOUNTER — Ambulatory Visit: Payer: Self-pay | Admitting: Cardiology

## 2016-02-12 ENCOUNTER — Encounter: Payer: Self-pay | Admitting: Cardiology

## 2016-02-12 ENCOUNTER — Ambulatory Visit (INDEPENDENT_AMBULATORY_CARE_PROVIDER_SITE_OTHER): Payer: Self-pay | Admitting: Cardiology

## 2016-02-12 ENCOUNTER — Encounter (INDEPENDENT_AMBULATORY_CARE_PROVIDER_SITE_OTHER): Payer: Self-pay

## 2016-02-12 VITALS — BP 132/82 | HR 72 | Ht 70.0 in | Wt 233.0 lb

## 2016-02-12 DIAGNOSIS — I2583 Coronary atherosclerosis due to lipid rich plaque: Secondary | ICD-10-CM

## 2016-02-12 DIAGNOSIS — E785 Hyperlipidemia, unspecified: Secondary | ICD-10-CM

## 2016-02-12 DIAGNOSIS — I2510 Atherosclerotic heart disease of native coronary artery without angina pectoris: Secondary | ICD-10-CM

## 2016-02-12 DIAGNOSIS — I10 Essential (primary) hypertension: Secondary | ICD-10-CM

## 2016-02-12 LAB — CBC WITH DIFFERENTIAL/PLATELET
Basophils Absolute: 0 cells/uL (ref 0–200)
Basophils Relative: 0 %
Eosinophils Absolute: 101 cells/uL (ref 15–500)
Eosinophils Relative: 1 %
HCT: 45.7 % (ref 38.5–50.0)
Hemoglobin: 15.8 g/dL (ref 13.2–17.1)
Lymphocytes Relative: 24 %
Lymphs Abs: 2424 cells/uL (ref 850–3900)
MCH: 32.2 pg (ref 27.0–33.0)
MCHC: 34.6 g/dL (ref 32.0–36.0)
MCV: 93.1 fL (ref 80.0–100.0)
MPV: 9 fL (ref 7.5–12.5)
Monocytes Absolute: 808 cells/uL (ref 200–950)
Monocytes Relative: 8 %
Neutro Abs: 6767 cells/uL (ref 1500–7800)
Neutrophils Relative %: 67 %
Platelets: 276 10*3/uL (ref 140–400)
RBC: 4.91 MIL/uL (ref 4.20–5.80)
RDW: 12.9 % (ref 11.0–15.0)
WBC: 10.1 10*3/uL (ref 3.8–10.8)

## 2016-02-12 LAB — LIPID PANEL
Cholesterol: 197 mg/dL (ref <200)
HDL: 44 mg/dL (ref >40)
LDL Cholesterol: 134 mg/dL — ABNORMAL HIGH
Total CHOL/HDL Ratio: 4.5 Ratio (ref <5.0)
Triglycerides: 96 mg/dL (ref <150)
VLDL: 19 mg/dL (ref <30)

## 2016-02-12 LAB — TSH: TSH: 1.05 mIU/L (ref 0.40–4.50)

## 2016-02-12 LAB — COMPREHENSIVE METABOLIC PANEL
ALT: 14 U/L (ref 9–46)
AST: 15 U/L (ref 10–35)
Albumin: 4.9 g/dL (ref 3.6–5.1)
Alkaline Phosphatase: 77 U/L (ref 40–115)
BUN: 10 mg/dL (ref 7–25)
CO2: 24 mmol/L (ref 20–31)
Calcium: 9.6 mg/dL (ref 8.6–10.3)
Chloride: 105 mmol/L (ref 98–110)
Creat: 0.81 mg/dL (ref 0.70–1.33)
Glucose, Bld: 88 mg/dL (ref 65–99)
Potassium: 4.2 mmol/L (ref 3.5–5.3)
Sodium: 139 mmol/L (ref 135–146)
Total Bilirubin: 0.6 mg/dL (ref 0.2–1.2)
Total Protein: 7.3 g/dL (ref 6.1–8.1)

## 2016-02-12 MED ORDER — CLOPIDOGREL BISULFATE 75 MG PO TABS: 75.0000 mg | ORAL_TABLET | Freq: Every day | ORAL | 3 refills | Status: DC

## 2016-02-12 NOTE — Progress Notes (Signed)
Cardiology Office Note    Date:  02/12/2016   ID:  Ernest Bishop, DOB 03/13/1961, MRN [id number]  PCP:  Nelwyn SalisburyFRY,STEPHEN A, MD  Cardiologist:  Tobias AlexanderKatarina Bethene Hankinson, MD   History of Present Illness:  Ernest Bishop is a 55 y.o. male history of CAD, In June, 2014 the patient had an episode of chest discomfort and [facility] [place]. He was taken immediately to the cath lab. He drug-eluting stents to OM1 and the distal circumflex.  He has been doing really well since then he remains very active and exercises with no symptoms of chest pain, shortness of breath palpitation or syncope. He also denies lower extremity edema orthopnea or proximal nocturnal dyspnea. His only concern is cost of Effient. He has been experiencing intermittent nausea and heartburn like symptoms.  Past Medical History:  Diagnosis Date  . Alcohol use    Regular alcohol use, to cut down   . CAD (coronary artery disease)    Non-STEMI January 02, 2011, DES to OM, residual chronically occluded small RCA with collaterals  . Dyslipidemia   . Ejection fraction    EF 40% at the time of acute MI /  EF 55%, January 19, 2011, outpatient echo, no focal wall motion abnormalities.  Marland Kitchen. GERD (gastroesophageal reflux disease)   . Hiatal hernia    history  . Hyperlipidemia   . Hypertension   . MI (myocardial infarction)    Past Surgical History:  Procedure Laterality Date  . CARDIAC CATHETERIZATION  01/02/2011  . CORONARY ANGIOPLASTY WITH STENT PLACEMENT    . OTHER SURGICAL HISTORY     intestinal repair surgery after traumatic injury at the age of 89   Current Medications: Outpatient Medications Prior to Visit  Medication Sig Dispense Refill  . amLODipine (NORVASC) 5 MG tablet Take 1 tablet (5 mg total) by mouth daily. 30 tablet 11  . aspirin 81 MG tablet Take 81 mg by mouth daily.      Marland Kitchen. atorvastatin (LIPITOR) 40 MG tablet TAKE ONE TABLET BY MOUTH AT BEDTIME 30 tablet 9  . hydrochlorothiazide  (MICROZIDE) 12.5 MG capsule Take 1 capsule (12.5 mg total) by mouth daily. 30 capsule 11  . losartan (COZAAR) 50 MG tablet Take 1 tablet (50 mg total) by mouth daily. 30 tablet 11  . metoprolol tartrate (LOPRESSOR) 25 MG tablet Take 1 tablet (25 mg total) by mouth 2 (two) times daily. 180 tablet 0  . nitroGLYCERIN (NITROSTAT) 0.4 MG SL tablet Place 1 tablet (0.4 mg total) under the tongue every 5 (five) minutes as needed. 25 tablet 11  . prasugrel (EFFIENT) 10 MG TABS tablet Take 1 tablet (10 mg total) by mouth daily. 30 tablet 1   No facility-administered medications prior to visit.     Allergies:   Codeine and Hydrocodone   Social History   Social History  . Marital status: Married    Spouse name: N/A  . Number of children: 1  . Years of education: N/A   Occupational History  . flooring    Social History Main Topics  . Smoking status: Former Games developermoker  . Smokeless tobacco: Never Used  . Alcohol use Yes  . Drug use: No  . Sexual activity: Not Asked   Other Topics Concern  . None   Social History Narrative  . None    Family History:  The patient's family history includes Alcohol abuse in his father; Arthritis in his mother; Hypertension in his brother and mother;  Tremor in his mother.   ROS:   Please see the history of present illness.    ROS All other systems reviewed and are negative.  PHYSICAL EXAM:   VS:  BP 132/82   Pulse 72   Ht 5\' 10"  (1.778 m)   Wt 233 lb (105.7 kg)   BMI 33.43 kg/m    GEN: Well nourished, well developed, in no acute distress  HEENT: normal  Neck: no JVD, carotid bruits, or masses Cardiac: RRR; no murmurs, rubs, or gallops,no edema  Respiratory:  clear to auscultation bilaterally, normal work of breathing GI: soft, nontender, nondistended, + BS MS: no deformity or atrophy  Skin: warm and dry, no rash Neuro:  Alert and Oriented x 3, Strength and sensation are intact Psych: euthymic mood, full affect  Wt Readings from Last 3 Encounters:    02/12/16 233 lb (105.7 kg)  01/21/15 254 lb (115.2 kg)  01/03/14 234 lb 12.8 oz (106.5 kg)      Studies/Labs Reviewed:   EKG:  EKG is ordered today.  The ekg ordered today demonstrates SR< normal ECG  Recent Labs: No results found for requested labs within last 8760 hours.   Lipid Panel    Component Value Date/Time   CHOL 135 08/02/2012 1154   TRIG 97.0 08/02/2012 1154   HDL 45.10 08/02/2012 1154   CHOLHDL 3 08/02/2012 1154   VLDL 19.4 08/02/2012 1154   LDLCALC 71 08/02/2012 1154    Additional studies/ records that were reviewed today include:  EKG shows normal sinus rhythm remains normal EKG.    ASSESSMENT:    1. Coronary artery disease due to lipid rich plaque   2. Dyslipidemia   3. Essential hypertension     PLAN:   In order of problems listed above:  1. CAD, s/p PCI to 3 vessels in the past, he is asymptomatic, ECG shows only non-specific changes in the inferolateral leads, no ischemic work up right now. We will continue ASA, Discontinue Effient and start Plavix daily. Continue losartan, metoprolol.  2. Hypertension - well controlled.  3. Hyperlipidemia - check lipids and CMP, continue atorvastatin 40 mg po daily  Follow up in 1 year.    Medication Adjustments/Labs and Tests Ordered: Current medicines are reviewed at length with the patient today.  Concerns regarding medicines are outlined above.  Medication changes, Labs and Tests ordered today are listed in the Patient Instructions below. There are no Patient Instructions on file for this visit.   Signed, Tobias AlexanderKatarina Mckenleigh Tarlton, MD  02/12/2016 1:49 PM    [facility] [address], [place], [place]  [address] Phone: [phone]) [phone]; Fax: [phone]) [phone]

## 2016-02-12 NOTE — Patient Instructions (Signed)
Medication Instructions:   STOP TAKING EFFIENT   START TAKING PLAVIX 75 MG ONCE DAILY   Labwork:  TODAY--CMET, CBC W DIFF, TSH, AND LIPIDS    Follow-Up:  Your physician wants you to follow-up in: ONE YEAR WITH DR Johnell ComingsNELSON You will receive a reminder letter in the mail two months in advance. If you don't receive a letter, please call our office to schedule the follow-up appointment.      If you need a refill on your cardiac medications before your next appointment, please call your pharmacy.

## 2016-02-13 ENCOUNTER — Telehealth: Payer: Self-pay | Admitting: *Deleted

## 2016-02-13 MED ORDER — ATORVASTATIN CALCIUM 40 MG PO TABS: 40.0000 mg | ORAL_TABLET | Freq: Every day | ORAL | 3 refills | Status: DC

## 2016-02-13 NOTE — Telephone Encounter (Signed)
Informed to the pt Dr Lindaann SloughNelson's recommendation for him to be compliant with taking his atorvastatin daily.   Informed him that per Dr Delton SeeNelson, he is only 55 years old and already has stenting to all history coronary arteries that would definitely encouraged he restarts his medication.  Informed the pt that I will send in refills of his atorvastatin to his confirmed pharmacy of choice. Pt verbalized understanding and agrees with this plan.

## 2016-02-13 NOTE — Telephone Encounter (Signed)
-----   Message from Lars MassonKatarina H Nelson, MD sent at 02/13/2016  2:50 PM EST ----- He is only 55 years old and already has stenting to all history coronary arteries that would definitely encouraged he restarts his medication.  ----- Message ----- From: Loa SocksIvy M Elijio Staples, LPN Sent: 16/1/096011/12/2015   2:44 PM To: Lars MassonKatarina H Nelson, MD  Spoke with the pt and informed him of his lab results and his LDL doubling compared to past labs.  Asked the pt if he was taking his atorvastatin as prescribed, and the pt said he has NOT been taking this. Pt wanted me to endorse to Dr Delton SeeNelson that he will start taking it everyday and be strictly compliant with this.  Pt made apologies for this.  Informed the pt that I will endorse this information to Dr Delton SeeNelson, and reiterated to him to take this medication daily.  Pt verbalized understanding and agrees with this plan.

## 2016-03-05 ENCOUNTER — Other Ambulatory Visit: Payer: Self-pay | Admitting: Cardiology

## 2016-03-05 MED ORDER — METOPROLOL TARTRATE 25 MG PO TABS: 25.0000 mg | ORAL_TABLET | Freq: Two times a day (BID) | ORAL | 3 refills | Status: DC

## 2016-04-14 ENCOUNTER — Other Ambulatory Visit: Payer: Self-pay | Admitting: *Deleted

## 2016-04-14 MED ORDER — HYDROCHLOROTHIAZIDE 12.5 MG PO CAPS: 12.5000 mg | ORAL_CAPSULE | Freq: Every day | ORAL | 3 refills | Status: DC

## 2016-06-01 ENCOUNTER — Other Ambulatory Visit: Payer: Self-pay | Admitting: Cardiology

## 2016-06-01 MED ORDER — AMLODIPINE BESYLATE 5 MG PO TABS: 5.0000 mg | ORAL_TABLET | Freq: Every day | ORAL | 8 refills | Status: DC

## 2017-01-13 ENCOUNTER — Ambulatory Visit: Payer: Self-pay | Admitting: Physician Assistant

## 2017-01-13 NOTE — Progress Notes (Deleted)
Cardiology Office Note:    Date:  01/13/2017   ID:  Ernest Bishop, DOB 1960-12-16, MRN [id number]  PCP:  Ernest Salisbury, MD  Cardiologist:  Ernest Bishop Kitchen  Referring MD: Ernest Salisbury, MD   No chief complaint on file. ***  History of Present Illness:    Ernest Bishop is a 56 y.o. male with a hx of CAD status post non-STEMI in 9/12 treated with DES to the first obtuse marginal. He had a chronically occluded RCA at that time that was tx medically. He underwent PCI again in 09/2012 in [place], [place]. Records of Cardiac Catheterization done in [facility] are not located in his chart.  Notes indicate that he had a DES placed to the first obtuse marginal and distal circumflex. He was previously followed by Dr. Myrtis Bishop. He was last seen by Dr. Delton Bishop in 11/17. ***  Ernest Bishop ***  Prior CV studies:   The following studies were reviewed today:  Echocardiogram 01/19/11 Mild LVH, EF 50-55, normal wall motion, normal diastolic function, mild LAE, trivial pericardial effusion  Cardiac Catheterization 01/02/11 LAD okay LCx mild plaque, OM1 99 (culprit) RCA mid 100-CTO R-R collaterals treated medically EF 40 PCI: 2.5 x 60 mm Promus element DES to the OM11  Past Medical History:  Diagnosis Date  . Alcohol use    Regular alcohol use, to cut down   . CAD (coronary artery disease)    Non-STEMI January 02, 2011, DES to OM, residual chronically occluded small RCA with collaterals  . Dyslipidemia   . Ejection fraction    EF 40% at the time of acute MI /  EF 55%, January 19, 2011, outpatient echo, no focal wall motion abnormalities.  Ernest Bishop Kitchen GERD (gastroesophageal reflux disease)   . Hiatal hernia    history  . Hyperlipidemia   . Hypertension   . MI (myocardial infarction)     Past Surgical History:  Procedure Laterality Date  . CARDIAC CATHETERIZATION  01/02/2011  . CORONARY ANGIOPLASTY WITH STENT PLACEMENT    . OTHER SURGICAL HISTORY     intestinal repair surgery after traumatic injury at  the age of 31    Current Medications: No outpatient prescriptions have been marked as taking for the 01/13/17 encounter (Appointment) with Tereso Newcomer T, PA-C.     Allergies:   Codeine and Hydrocodone   Social History   Social History  . Marital status: Married    Spouse name: N/A  . Number of children: 1  . Years of education: N/A   Occupational History  . flooring    Social History Main Topics  . Smoking status: Former Games developer  . Smokeless tobacco: Never Used  . Alcohol use Yes  . Drug use: No  . Sexual activity: Not on file   Other Topics Concern  . Not on file   Social History Narrative  . No narrative on file     Family Hx: The patient's family history includes Alcohol abuse in his father; Arthritis in his mother; Hypertension in his brother and mother; Tremor in his mother.  ROS:   Please Bishop the history of present illness.    ROS All other systems reviewed and are negative.   EKGs/Labs/Other Test Reviewed:    EKG:  EKG is *** ordered today.  The ekg ordered today demonstrates ***  Recent Labs: 02/12/2016: ALT 14; BUN 10; Creat 0.81; Hemoglobin 15.8; Platelets 276; Potassium 4.2; Sodium 139; TSH 1.05   Recent Lipid Panel Lab Results  Component  Value Date/Time   CHOL 197 02/12/2016 01:56 PM   TRIG 96 02/12/2016 01:56 PM   HDL 44 02/12/2016 01:56 PM   CHOLHDL 4.5 02/12/2016 01:56 PM   LDLCALC 134 (H) 02/12/2016 01:56 PM    Physical Exam:    VS:  There were no vitals taken for this visit.    Wt Readings from Last 3 Encounters:  02/12/16 233 lb (105.7 kg)  01/21/15 254 lb (115.2 kg)  01/03/14 234 lb 12.8 oz (106.5 kg)     ***Physical Exam  ASSESSMENT:    1. Coronary artery disease involving native coronary artery of native heart without angina pectoris   2. Essential hypertension   3. Dyslipidemia    PLAN:    In order of problems listed above:  Coronary artery disease involving native coronary artery of native heart without angina  pectoris  Essential hypertension  Dyslipidemia***  Dispo:  No Follow-up on file.   Medication Adjustments/Labs and Tests Ordered: Current medicines are reviewed at length with the patient today.  Concerns regarding medicines are outlined above.  Tests Ordered: No orders of the defined types were placed in this encounter.  Medication Changes: No orders of the defined types were placed in this encounter.   Signed, Tereso Newcomer, PA-C  01/13/2017 8:36 AM    [facility] [address] [place], [place], [place]  [address] Phone: [phone]; Fax: [phone]

## 2017-02-02 ENCOUNTER — Ambulatory Visit (INDEPENDENT_AMBULATORY_CARE_PROVIDER_SITE_OTHER): Payer: Self-pay | Admitting: Physician Assistant

## 2017-02-02 ENCOUNTER — Encounter: Payer: Self-pay | Admitting: Physician Assistant

## 2017-02-02 VITALS — BP 146/88 | HR 71 | Ht 70.0 in | Wt 234.8 lb

## 2017-02-02 DIAGNOSIS — F419 Anxiety disorder, unspecified: Secondary | ICD-10-CM

## 2017-02-02 DIAGNOSIS — I10 Essential (primary) hypertension: Secondary | ICD-10-CM

## 2017-02-02 DIAGNOSIS — E785 Hyperlipidemia, unspecified: Secondary | ICD-10-CM

## 2017-02-02 DIAGNOSIS — I25110 Atherosclerotic heart disease of native coronary artery with unstable angina pectoris: Secondary | ICD-10-CM

## 2017-02-02 MED ORDER — NITROGLYCERIN 0.4 MG SL SUBL: 0.4000 mg | SUBLINGUAL_TABLET | SUBLINGUAL | 3 refills | Status: DC | PRN

## 2017-02-02 MED ORDER — METOPROLOL TARTRATE 50 MG PO TABS: 50.0000 mg | ORAL_TABLET | Freq: Two times a day (BID) | ORAL | 3 refills | Status: DC

## 2017-02-02 NOTE — Progress Notes (Signed)
Cardiology Office Note:    Date:  02/02/2017   ID:  Ernest Bishop, DOB 01/24/1961, MRN [id number]  PCP:  Nelwyn Salisbury, MD  Cardiologist:  Dr. Tobias Alexander    Referring MD: Nelwyn Salisbury, MD   Chief Complaint  Patient presents with  . Chest Pain    History of Present Illness:    Ernest Bishop is a 56 y.o. male with a hx of CAD status post non-STEMI in 9/12 treated with DES to the first obtuse marginal. He had a chronically occluded RCA at that time that was tx medically. He underwent PCI again in 09/2012 in [place], [place]. Records of Cardiac Catheterization done in [facility] are not located in his chart.  Notes indicate that he had a DES placed to the first obtuse marginal and distal circumflex. He was previously followed by Dr. Myrtis Ser. He was last seen by Dr. Delton See in 11/17.    [name] presents for evaluation of chest discomfort.  Over the past 6 months, he has noted left-sided chest discomfort with radiation to his left jaw and left arm and associated dyspnea.  This typically occurs with mild to moderate activity.  He has not taken nitroglycerin.  He usually stops to rest with resolution of symptoms in a couple of minutes.  He sometimes forgets his medications and notes worsening chest symptoms.  He has been taking his medications consistently for the last several days and has not had recurrent chest symptoms.  He denies PND, edema, syncope.  Prior CV studies:   The following studies were reviewed today:  Echocardiogram 01/19/11 Mild LVH, EF 50-55, normal wall motion, normal diastolic function, mild LAE, trivial pericardial effusion  Cardiac Catheterization 01/02/11 LAD okay LCx mild plaque, OM1 99 (culprit) RCA mid 100-CTO R-R collaterals treated medically EF 40 PCI: 2.5 x 60 mm Promus element DES to the OM11  Past Medical History:  Diagnosis Date  . Alcohol use    Regular alcohol use, to cut down   . CAD (coronary artery disease)    Non-STEMI January 02, 2011,  DES to OM, residual chronically occluded small RCA with collaterals  . Dyslipidemia   . Ejection fraction    EF 40% at the time of acute MI /  EF 55%, January 19, 2011, outpatient echo, no focal wall motion abnormalities.  Marland Kitchen GERD (gastroesophageal reflux disease)   . Hiatal hernia    history  . Hyperlipidemia   . Hypertension   . MI (myocardial infarction) [facility])     Past Surgical History:  Procedure Laterality Date  . CARDIAC CATHETERIZATION  01/02/2011  . CORONARY ANGIOPLASTY WITH STENT PLACEMENT    . OTHER SURGICAL HISTORY     intestinal repair surgery after traumatic injury at the age of 18    Current Medications: Current Meds  Medication Sig  . amLODipine (NORVASC) 5 MG tablet Take 1 tablet (5 mg total) by mouth daily.  Marland Kitchen aspirin 81 MG tablet Take 81 mg by mouth daily.    Marland Kitchen atorvastatin (LIPITOR) 40 MG tablet Take 1 tablet (40 mg total) by mouth at bedtime.  . clopidogrel (PLAVIX) 75 MG tablet Take 1 tablet (75 mg total) by mouth daily.  . hydrochlorothiazide (MICROZIDE) 12.5 MG capsule Take 1 capsule (12.5 mg total) by mouth daily.  Marland Kitchen losartan (COZAAR) 50 MG tablet TAKE ONE TABLET BY MOUTH EVERY DAY  . [DISCONTINUED] metoprolol tartrate (LOPRESSOR) 25 MG tablet Take 1 tablet (25 mg total) by mouth 2 (two) times daily.  . [  DISCONTINUED] nitroGLYCERIN (NITROSTAT) 0.4 MG SL tablet Place 0.4 mg under the tongue every 5 (five) minutes as needed for chest pain.     Allergies:   Codeine and Hydrocodone   Social History   Social History  . Marital status: Married    Spouse name: N/A  . Number of children: 1  . Years of education: N/A   Occupational History  . flooring    Social History Main Topics  . Smoking status: Former Games developermoker  . Smokeless tobacco: Never Used  . Alcohol use Yes  . Drug use: No  . Sexual activity: Not Asked   Other Topics Concern  . None   Social History Narrative  . None     Family Hx: The patient's family history includes Alcohol abuse in  his father; Arthritis in his mother; Hypertension in his brother and mother; Tremor in his mother.  ROS:   Please see the history of present illness.    Review of Systems  Constitution: Positive for malaise/fatigue.  Cardiovascular: Positive for chest pain and dyspnea on exertion.  Musculoskeletal: Positive for back pain, joint pain and myalgias.  Gastrointestinal: Positive for nausea.  Psychiatric/Behavioral: The patient is nervous/anxious.    All other systems reviewed and are negative.   EKGs/Labs/Other Test Reviewed:    EKG:  EKG is  ordered today.  The ekg ordered today demonstrates normal sinus rhythm, heart rate 71, left axis deviation, septal Q waves, nonspecific ST-T wave changes, QTC 428 ms, no significant changes  Recent Labs: 02/12/2016: ALT 14; BUN 10; Creat 0.81; Hemoglobin 15.8; Platelets 276; Potassium 4.2; Sodium 139; TSH 1.05   Recent Lipid Panel Lab Results  Component Value Date/Time   CHOL 197 02/12/2016 01:56 PM   TRIG 96 02/12/2016 01:56 PM   HDL 44 02/12/2016 01:56 PM   CHOLHDL 4.5 02/12/2016 01:56 PM   LDLCALC 134 (H) 02/12/2016 01:56 PM    Physical Exam:    VS:  BP (!) 146/88   Pulse 71   Ht 5\' 10"  (1.778 m)   Wt 234 lb 12.8 oz (106.5 kg)   BMI 33.69 kg/m     Wt Readings from Last 3 Encounters:  02/02/17 234 lb 12.8 oz (106.5 kg)  02/12/16 233 lb (105.7 kg)  01/21/15 254 lb (115.2 kg)     Physical Exam  Constitutional: He is oriented to person, place, and time. He appears well-developed and well-nourished. No distress.  HENT:  Head: Normocephalic and atraumatic.  Eyes: No scleral icterus.  Neck: No JVD present.  Cardiovascular: Normal rate and regular rhythm.   No murmur heard. Pulmonary/Chest: Effort normal. He has no rales.  Abdominal: Soft.  Musculoskeletal: He exhibits no edema.  Neurological: He is alert and oriented to person, place, and time.  Skin: Skin is warm and dry.    ASSESSMENT:    1. Coronary artery disease involving  native coronary artery of native heart with unstable angina pectoris (HCC)   2. Essential hypertension   3. Dyslipidemia   4. Anxiety    PLAN:    In order of problems listed above:  1.  Coronary artery disease involving native coronary artery of native heart with unstable angina pectoris (HCC)  History of non-STEMI in 9/12 treated with DES to the OM1.  Subsequently, he had a DES placed to the OM1 and distal circumflex in 09/2012.  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  ECG does not demonstrate any acute findings.  He  currently takes 2 antianginals (amlodipine, metoprolol).  I have recommended cardiac catheterization to further evaluate his symptoms.  I discussed this with Dr. Delton See, his primary cardiologist, who agreed.  Risks and benefits of cardiac catheterization have been discussed with the patient.  These include bleeding, infection, kidney damage, stroke, heart attack, death.  The patient understands these risks and is willing to proceed.  However, he is concerned about the cost of his procedure.  He does not have insurance.  Before scheduling, we will have someone from our billing department contact him.  -Increase metoprolol to 50 mg twice daily  -Continue aspirin, Plavix, statin, amlodipine  -Arrange cardiac catheterization in the next 2 weeks  2.  Essential hypertension Blood pressure is uncontrolled.  Adjust medications as above.  3.  Dyslipidemia Continue statin therapy.  4.  Anxiety He notes symptoms of anxiety.  This is worse recently.  His wife thinks he may need to be on Zoloft.  I have asked him to follow-up with his primary care physician to discuss this further.   Dispo:  Return in about 2 weeks (around 02/16/2017) for Post Procedure Follow Up w/ Dr. Delton See.   Medication Adjustments/Labs and Tests Ordered: Current medicines are reviewed at length with the patient today.  Concerns regarding medicines are outlined  above.  Tests Ordered: Orders Placed This Encounter  Procedures  . EKG 12-Lead   Medication Changes: Meds ordered this encounter  Medications  . nitroGLYCERIN (NITROSTAT) 0.4 MG SL tablet    Sig: Place 1 tablet (0.4 mg total) under the tongue every 5 (five) minutes as needed for chest pain.    Dispense:  25 tablet    Refill:  3  . metoprolol tartrate (LOPRESSOR) 50 MG tablet    Sig: Take 1 tablet (50 mg total) by mouth 2 (two) times daily.    Dispense:  180 tablet    Refill:  3    Signed, Tereso Newcomer, PA-C  02/02/2017 12:59 PM    [facility] [address] [place], [place], [place]  [address] Phone: [phone]; Fax: [phone]

## 2017-02-02 NOTE — Patient Instructions (Signed)
Medication Instructions:  1. INCREASE METOPROLOL TARTRATE TO 50 MG TWICE DAILY ; NEW RX HAS BEEN SENT IN  2. REFILL SENT IN FOR NTG Labwork: NONE ORDERED   Testing/Procedures: Your physician has requested that you have a cardiac catheterization. Cardiac catheterization is used to diagnose and/or treat various heart conditions. Doctors may recommend this procedure for a number of different reasons. The most common reason is to evaluate chest pain. Chest pain can be a symptom of coronary artery disease (CAD), and cardiac catheterization can show whether plaque is narrowing or blocking your heart's arteries. This procedure is also used to evaluate the valves, as well as measure the blood flow and oxygen levels in different parts of your heart. For further information please visit [email or web address]. Please follow instruction sheet, as given. PLEASE CALL BACK IN THE NEXT 1-2 WEEKS SO THAT WE MAY SCHEDULE CATH.   I WILL HAVE OUR BILLING DEPT CALL YOU WITH THE ESTIMATED COST OF CATH    Follow-Up: DR. Delton SeeNELSON 2 WEEK POST CATH   Any Other Special Instructions Will Be Listed Below (If Applicable).     If you need a refill on your cardiac medications before your next appointment, please call your pharmacy.

## 2017-03-22 ENCOUNTER — Telehealth: Payer: Self-pay | Admitting: Physician Assistant

## 2017-03-22 NOTE — Telephone Encounter (Signed)
Mrs. Sydnee LevansMiddleton is calling to get the Cath schedule .  If you want to go ahead any schedule it and just call her with the appt date and time , she can do it any day after Christmas.  Thanks

## 2017-03-22 NOTE — Telephone Encounter (Signed)
I called pt and s/w his wife (DPR). I explained to pt's wife that I confirmed with Dr. Eldridge DaceVaranasi (DOD) before we can schedule pt for the cath he will need to be seen in the office again since he was last seen 02/02/17. I explained to her that there would need to be an updated H&P that cannot be over 4230 days old as well as recent lab work. She is agreeable to this plan of care for the pt and has scheduled pt to see Tereso NewcomerScott Weaver, PA 04/14/17 @ 10:45. Pt's wife thanked me for my help and said they will see us then.

## 2017-04-05 ENCOUNTER — Telehealth: Payer: Self-pay | Admitting: Physician Assistant

## 2017-04-05 NOTE — Telephone Encounter (Signed)
New Message     Patient wants to schedule heart cath is there something you can do to get him in earlier

## 2017-04-05 NOTE — Telephone Encounter (Signed)
I returned pt's call who asked for a sooner appt to discuss possible scheduling of cath. Pt has been scheduled to see Boyce MediciBrittany Simmons, PA 04/09/17 @ 11 am. I have cancelled Tereso NewcomerScott Weaver, PA 04/14/17 appt. Pt states his symptoms have been on and off, some SOB, jaw pain, arm pain. Pt states after he does some breathing exercises for a few minutes his symptoms resolve. Pt does have stents. I advised pt he really should go to the ED for evaluation of his symptoms. Pt said he might do that but to keep his appt scheduled for 04/09/17 with Boyce MediciBrittany Simmons, PA. Pt thanked me for calling him back and getting a sooner appt.

## 2017-04-09 ENCOUNTER — Encounter (INDEPENDENT_AMBULATORY_CARE_PROVIDER_SITE_OTHER): Payer: Self-pay

## 2017-04-09 ENCOUNTER — Encounter: Payer: Self-pay | Admitting: Cardiology

## 2017-04-09 ENCOUNTER — Ambulatory Visit (INDEPENDENT_AMBULATORY_CARE_PROVIDER_SITE_OTHER): Payer: Self-pay | Admitting: Cardiology

## 2017-04-09 VITALS — BP 116/74 | HR 73 | Ht 70.0 in | Wt 230.0 lb

## 2017-04-09 DIAGNOSIS — Z01812 Encounter for preprocedural laboratory examination: Secondary | ICD-10-CM

## 2017-04-09 DIAGNOSIS — I25110 Atherosclerotic heart disease of native coronary artery with unstable angina pectoris: Secondary | ICD-10-CM

## 2017-04-09 NOTE — Progress Notes (Signed)
04/09/2017 CHADEN DOOM   1961/04/01  [id number]  Primary Physician Nelwyn Salisbury, MD Primary Cardiologist: Dr. Delton See  Reason for Visit/CC: Exertional Angina   HPI:  Ernest Bishop is a 57 y.o. male who is being seen today for chest pain c/w unstable angina.   He is followed by Dr. Delton See and has known CAD, status post non-STEMI in 9/12 treated with DES to the first obtuse marginal. He had a chronically occluded RCA at that time that was tx medically. He underwent PCI again in 09/2012 in [place], [place]. Records of Cardiac Catheterization done in [facility] are not located in his chart.  Notes indicate that he had a DES placed to the first obtuse marginal and distal circumflex.  He was seen by Tereso Newcomer, PA-C, on 02/02/17 and complained of exertional CP c/w previous anginal symptoms, relieved with rest and SL NTG. He was ordered to get a LHC and his antianginals were also adjusted. Metoprolol was increased to 50 mg BID. Pt did not schedule LHC at that time due to lack of insurance. He also noted that he had a good initial response after increase in metoprolol. However, he has had recurrent symptoms. He continues to have exertional left sided chest pain radiating to his left neck and arm. Symptoms resolve with rest. He denies symptoms at rest. He also notes new fatigue. He is currently CP free and now wants to schedule LHC. BP is well controlled at 116/74. HR 73 bpm.   Current Meds  Medication Sig  . amLODipine (NORVASC) 5 MG tablet Take 1 tablet (5 mg total) by mouth daily.  Marland Kitchen aspirin 81 MG tablet Take 81 mg by mouth daily.    Marland Kitchen atorvastatin (LIPITOR) 40 MG tablet Take 1 tablet (40 mg total) by mouth at bedtime.  . clopidogrel (PLAVIX) 75 MG tablet Take 1 tablet (75 mg total) by mouth daily.  . hydrochlorothiazide (MICROZIDE) 12.5 MG capsule Take 1 capsule (12.5 mg total) by mouth daily.  Marland Kitchen losartan (COZAAR) 50 MG tablet TAKE ONE TABLET BY MOUTH EVERY DAY  . metoprolol tartrate  (LOPRESSOR) 50 MG tablet Take 1 tablet (50 mg total) by mouth 2 (two) times daily.  . nitroGLYCERIN (NITROSTAT) 0.4 MG SL tablet Place 1 tablet (0.4 mg total) under the tongue every 5 (five) minutes as needed for chest pain.   Allergies  Allergen Reactions  . Codeine Itching  . Hydrocodone Itching   Past Medical History:  Diagnosis Date  . Alcohol use    Regular alcohol use, to cut down   . CAD (coronary artery disease)    Non-STEMI January 02, 2011, DES to OM, residual chronically occluded small RCA with collaterals  . Dyslipidemia   . Ejection fraction    EF 40% at the time of acute MI /  EF 55%, January 19, 2011, outpatient echo, no focal wall motion abnormalities.  Marland Kitchen GERD (gastroesophageal reflux disease)   . Hiatal hernia    history  . Hyperlipidemia   . Hypertension   . MI (myocardial infarction) (HCC)    Family History  Problem Relation Age of Onset  . Hypertension Mother   . Arthritis Mother   . Tremor Mother   . Alcohol abuse Father        had heart surgery, family is estranged from father  . Hypertension Brother    Past Surgical History:  Procedure Laterality Date  . CARDIAC CATHETERIZATION  01/02/2011  . CORONARY ANGIOPLASTY WITH STENT PLACEMENT    .  OTHER SURGICAL HISTORY     intestinal repair surgery after traumatic injury at the age of 89   Social History   Socioeconomic History  . Marital status: Married    Spouse name: Not on file  . Number of children: 1  . Years of education: Not on file  . Highest education level: Not on file  Social Needs  . Financial resource strain: Not on file  . Food insecurity - worry: Not on file  . Food insecurity - inability: Not on file  . Transportation needs - medical: Not on file  . Transportation needs - non-medical: Not on file  Occupational History  . Occupation: flooring  Tobacco Use  . Smoking status: Former Games developermoker  . Smokeless tobacco: Never Used  Substance and Sexual Activity  . Alcohol use: Yes  .  Drug use: No  . Sexual activity: Not on file  Other Topics Concern  . Not on file  Social History Narrative  . Not on file     Review of Systems: General: negative for chills, fever, night sweats or weight changes.  Cardiovascular: negative for chest pain, dyspnea on exertion, edema, orthopnea, palpitations, paroxysmal nocturnal dyspnea or shortness of breath Dermatological: negative for rash Respiratory: negative for cough or wheezing Urologic: negative for hematuria Abdominal: negative for nausea, vomiting, diarrhea, bright red blood per rectum, melena, or hematemesis Neurologic: negative for visual changes, syncope, or dizziness All other systems reviewed and are otherwise negative except as noted above.   Physical Exam:  Blood pressure 116/74, pulse 73, height 5\' 10"  (1.778 m), weight 230 lb (104.3 kg).  General appearance: alert, cooperative and no distress Neck: no carotid bruit and no JVD Lungs: clear to auscultation bilaterally Heart: regular rate and rhythm, S1, S2 normal, no murmur, click, rub or gallop Extremities: extremities normal, atraumatic, no cyanosis or edema Pulses: 2+ and symmetric Skin: Skin color, texture, turgor normal. No rashes or lesions Neurologic: Grossly normal  EKG not performed -- personally reviewed   ASSESSMENT AND PLAN:    1. CAD w/ unstable Angina: History of non-STEMI in 9/12 treated with DES to the OM1.  Subsequently, he had a DES placed to the OM1 and distal circumflex in 09/2012. He is having recurrent exertional CP, relieved with rest and SL NTG, despite recent increase in antianginal regimen. Also new fatigue. He is currently pain free at rest.  [facility]. I have reviewed the risks, indications, and alternatives to cardiac catheterization and possible angioplasty/stenting with the patient. Risks include but are not limited to bleeding, infection, vascular injury, stroke, myocardial infection, arrhythmia,  kidney injury, radiation-related injury in the case of prolonged fluoroscopy use, emergency cardiac surgery, and death. The patient understands the risks of serious complication is low (<1%). We will obtain pre-cath labs today. Pt advised to hold HCTZ and Losartan day of procedure. He has SL NTG at home for emergency use. We discussed indication and pt advised to go to closest ED if he has severe recurrence, unrelieved with nitro.    Follow-Up post cath.   Brittainy Delmer IslamSimmons PA-C, [facility] 04/09/2017 11:17 AM

## 2017-04-09 NOTE — Patient Instructions (Addendum)
Medication Instructions:  Your physician recommends that you continue on your current medications as directed. Please refer to the Current Medication list given to you today.  Labwork: Your physician recommends that you have lab work today-BMET, CBC, and PT/INR  Testing/Procedures: Your physician has requested that you have a cardiac catheterization. Cardiac catheterization is used to diagnose and/or treat various heart conditions. Doctors may recommend this procedure for a number of different reasons. The most common reason is to evaluate chest pain. Chest pain can be a symptom of coronary artery disease (CAD), and cardiac catheterization can show whether plaque is narrowing or blocking your heart's arteries. This procedure is also used to evaluate the valves, as well as measure the blood flow and oxygen levels in different parts of your heart. For further information please visit [email or web address] Please follow instruction sheet, as given.  Follow-Up: Your physician recommends that you schedule a follow-up appointment in: 2 weeks with Dr. Delton SeeNelson or PA/NP.  If you need a refill on your cardiac medications before your next appointment, please call your pharmacy.    [facility] [address] [facility] [address], [address] [place] [address] Dept: [phone] Loc: [phone]  Ernest PolingKevin L Bishop  04/09/2017  You are scheduled for a Cardiac Catheterization on Tuesday, January 8 with Dr. Peter SwazilandJordan.  1. Please [facility]: [address] [place], [place] [address] at 11:30 AM (two hours before your procedure to ensure your preparation). Free valet parking service is available.   Special note: Every effort is made to have your procedure done on time. Please understand that emergencies sometimes delay scheduled procedures.  2. Diet: Do not eat or drink anything after  midnight prior to your procedure except sips of water to take medications.  3. Labs: Today  4. Medication instructions in preparation for your procedure:  Hold  Losartan on Tuesday, January 8.   Hold  Hydrochlorothiazide on Tuesday, January 8.    On the morning of your procedure, take your Aspirin and Plavix/Clopidogrel and any morning medicines NOT listed above.  You may use sips of water.  5. Plan for one night stay--bring personal belongings. 6. Bring a current list of your medications and current insurance cards. 7. You MUST have a responsible person to drive you home. 8. Someone MUST be with you the first 24 hours after you arrive home or your discharge will be delayed. 9. Please wear clothes that are easy to get on and off and wear slip-on shoes.  Thank you for allowing us to care for you!   -- [facility]

## 2017-04-09 NOTE — H&P (View-Only) (Signed)
04/09/2017 Ernest Bishop   1961/04/01  [id number]  Primary Physician Nelwyn Salisbury, MD Primary Cardiologist: Dr. Delton See  Reason for Visit/CC: Exertional Angina   HPI:  Ernest Bishop is a 57 y.o. male who is being seen today for chest pain c/w unstable angina.   He is followed by Dr. Delton See and has known CAD, status post non-STEMI in 9/12 treated with DES to the first obtuse marginal. He had a chronically occluded RCA at that time that was tx medically. He underwent PCI again in 09/2012 in [place], [place]. Records of Cardiac Catheterization done in [facility] are not located in his chart.  Notes indicate that he had a DES placed to the first obtuse marginal and distal circumflex.  He was seen by Tereso Newcomer, PA-C, on 02/02/17 and complained of exertional CP c/w previous anginal symptoms, relieved with rest and SL NTG. He was ordered to get a LHC and his antianginals were also adjusted. Metoprolol was increased to 50 mg BID. Pt did not schedule LHC at that time due to lack of insurance. He also noted that he had a good initial response after increase in metoprolol. However, he has had recurrent symptoms. He continues to have exertional left sided chest pain radiating to his left neck and arm. Symptoms resolve with rest. He denies symptoms at rest. He also notes new fatigue. He is currently CP free and now wants to schedule LHC. BP is well controlled at 116/74. HR 73 bpm.   Current Meds  Medication Sig  . amLODipine (NORVASC) 5 MG tablet Take 1 tablet (5 mg total) by mouth daily.  Marland Kitchen aspirin 81 MG tablet Take 81 mg by mouth daily.    Marland Kitchen atorvastatin (LIPITOR) 40 MG tablet Take 1 tablet (40 mg total) by mouth at bedtime.  . clopidogrel (PLAVIX) 75 MG tablet Take 1 tablet (75 mg total) by mouth daily.  . hydrochlorothiazide (MICROZIDE) 12.5 MG capsule Take 1 capsule (12.5 mg total) by mouth daily.  Marland Kitchen losartan (COZAAR) 50 MG tablet TAKE ONE TABLET BY MOUTH EVERY DAY  . metoprolol tartrate  (LOPRESSOR) 50 MG tablet Take 1 tablet (50 mg total) by mouth 2 (two) times daily.  . nitroGLYCERIN (NITROSTAT) 0.4 MG SL tablet Place 1 tablet (0.4 mg total) under the tongue every 5 (five) minutes as needed for chest pain.   Allergies  Allergen Reactions  . Codeine Itching  . Hydrocodone Itching   Past Medical History:  Diagnosis Date  . Alcohol use    Regular alcohol use, to cut down   . CAD (coronary artery disease)    Non-STEMI January 02, 2011, DES to OM, residual chronically occluded small RCA with collaterals  . Dyslipidemia   . Ejection fraction    EF 40% at the time of acute MI /  EF 55%, January 19, 2011, outpatient echo, no focal wall motion abnormalities.  Marland Kitchen GERD (gastroesophageal reflux disease)   . Hiatal hernia    history  . Hyperlipidemia   . Hypertension   . MI (myocardial infarction) (HCC)    Family History  Problem Relation Age of Onset  . Hypertension Mother   . Arthritis Mother   . Tremor Mother   . Alcohol abuse Father        had heart surgery, family is estranged from father  . Hypertension Brother    Past Surgical History:  Procedure Laterality Date  . CARDIAC CATHETERIZATION  01/02/2011  . CORONARY ANGIOPLASTY WITH STENT PLACEMENT    .  OTHER SURGICAL HISTORY     intestinal repair surgery after traumatic injury at the age of 89   Social History   Socioeconomic History  . Marital status: Married    Spouse name: Not on file  . Number of children: 1  . Years of education: Not on file  . Highest education level: Not on file  Social Needs  . Financial resource strain: Not on file  . Food insecurity - worry: Not on file  . Food insecurity - inability: Not on file  . Transportation needs - medical: Not on file  . Transportation needs - non-medical: Not on file  Occupational History  . Occupation: flooring  Tobacco Use  . Smoking status: Former Games developermoker  . Smokeless tobacco: Never Used  Substance and Sexual Activity  . Alcohol use: Yes  .  Drug use: No  . Sexual activity: Not on file  Other Topics Concern  . Not on file  Social History Narrative  . Not on file     Review of Systems: General: negative for chills, fever, night sweats or weight changes.  Cardiovascular: negative for chest pain, dyspnea on exertion, edema, orthopnea, palpitations, paroxysmal nocturnal dyspnea or shortness of breath Dermatological: negative for rash Respiratory: negative for cough or wheezing Urologic: negative for hematuria Abdominal: negative for nausea, vomiting, diarrhea, bright red blood per rectum, melena, or hematemesis Neurologic: negative for visual changes, syncope, or dizziness All other systems reviewed and are otherwise negative except as noted above.   Physical Exam:  Blood pressure 116/74, pulse 73, height 5\' 10"  (1.778 m), weight 230 lb (104.3 kg).  General appearance: alert, cooperative and no distress Neck: no carotid bruit and no JVD Lungs: clear to auscultation bilaterally Heart: regular rate and rhythm, S1, S2 normal, no murmur, click, rub or gallop Extremities: extremities normal, atraumatic, no cyanosis or edema Pulses: 2+ and symmetric Skin: Skin color, texture, turgor normal. No rashes or lesions Neurologic: Grossly normal  EKG not performed -- personally reviewed   ASSESSMENT AND PLAN:    1. CAD w/ unstable Angina: History of non-STEMI in 9/12 treated with DES to the OM1.  Subsequently, he had a DES placed to the OM1 and distal circumflex in 09/2012. He is having recurrent exertional CP, relieved with rest and SL NTG, despite recent increase in antianginal regimen. Also new fatigue. He is currently pain free at rest.  [facility]. I have reviewed the risks, indications, and alternatives to cardiac catheterization and possible angioplasty/stenting with the patient. Risks include but are not limited to bleeding, infection, vascular injury, stroke, myocardial infection, arrhythmia,  kidney injury, radiation-related injury in the case of prolonged fluoroscopy use, emergency cardiac surgery, and death. The patient understands the risks of serious complication is low (<1%). We will obtain pre-cath labs today. Pt advised to hold HCTZ and Losartan day of procedure. He has SL NTG at home for emergency use. We discussed indication and pt advised to go to closest ED if he has severe recurrence, unrelieved with nitro.    Follow-Up post cath.   Brittainy Delmer IslamSimmons PA-C, [facility] 04/09/2017 11:17 AM

## 2017-04-10 LAB — CBC WITH DIFFERENTIAL/PLATELET
BASOS: 1 %
Basophils Absolute: 0.1 10*3/uL (ref 0.0–0.2)
EOS (ABSOLUTE): 0.2 10*3/uL (ref 0.0–0.4)
Eos: 3 %
HEMATOCRIT: 45.1 % (ref 37.5–51.0)
HEMOGLOBIN: 16.1 g/dL (ref 13.0–17.7)
Immature Grans (Abs): 0 10*3/uL (ref 0.0–0.1)
Immature Granulocytes: 0 %
LYMPHS ABS: 2.5 10*3/uL (ref 0.7–3.1)
Lymphs: 26 %
MCH: 31.9 pg (ref 26.6–33.0)
MCHC: 35.7 g/dL (ref 31.5–35.7)
MCV: 90 fL (ref 79–97)
MONOCYTES: 8 %
Monocytes Absolute: 0.8 10*3/uL (ref 0.1–0.9)
NEUTROS ABS: 5.9 10*3/uL (ref 1.4–7.0)
Neutrophils: 62 %
Platelets: 274 10*3/uL (ref 150–379)
RBC: 5.04 x10E6/uL (ref 4.14–5.80)
RDW: 13 % (ref 12.3–15.4)
WBC: 9.5 10*3/uL (ref 3.4–10.8)

## 2017-04-10 LAB — BASIC METABOLIC PANEL
BUN / CREAT RATIO: 10 (ref 9–20)
BUN: 9 mg/dL (ref 6–24)
CHLORIDE: 104 mmol/L (ref 96–106)
CO2: 20 mmol/L (ref 20–29)
Calcium: 10.1 mg/dL (ref 8.7–10.2)
Creatinine, Ser: 0.94 mg/dL (ref 0.76–1.27)
GFR, EST AFRICAN AMERICAN: 104 mL/min/{1.73_m2} (ref >59)
GFR, EST NON AFRICAN AMERICAN: 90 mL/min/{1.73_m2} (ref >59)
Glucose: 97 mg/dL (ref 65–99)
POTASSIUM: 4.4 mmol/L (ref 3.5–5.2)
Sodium: 140 mmol/L (ref 134–144)

## 2017-04-10 LAB — PROTIME-INR
INR: 1 (ref 0.8–1.2)
Prothrombin Time: 10.5 s (ref 9.1–12.0)

## 2017-04-12 ENCOUNTER — Telehealth: Payer: Self-pay

## 2017-04-12 NOTE — Telephone Encounter (Signed)
Patient [facility] scheduled for:  04/13/2017 @ 1330 Verified arrival time and place:  NT @ 1130 Confirmed AM meds to be taken pre-cath with sip of water: Take ASA/Plavix Hold losartan/HCTZ Confirmed patient has responsible person to drive home post procedure and observe patient for 24 hours:  yes Addl concerns:  none

## 2017-04-13 ENCOUNTER — Encounter (HOSPITAL_COMMUNITY): Payer: Self-pay | Admitting: General Practice

## 2017-04-13 ENCOUNTER — Ambulatory Visit (HOSPITAL_COMMUNITY)
Admission: RE | Admit: 2017-04-13 | Discharge: 2017-04-14 | Disposition: A | Discharge status: Home / Self Care | Payer: Self-pay | Source: Ambulatory Visit | Attending: Cardiology | Admitting: Cardiology

## 2017-04-13 ENCOUNTER — Encounter (HOSPITAL_COMMUNITY)
Admission: RE | Disposition: A | Discharge status: Home / Self Care | Payer: Self-pay | Source: Ambulatory Visit | Attending: Cardiology

## 2017-04-13 DIAGNOSIS — E785 Hyperlipidemia, unspecified: Secondary | ICD-10-CM | POA: Insufficient documentation

## 2017-04-13 DIAGNOSIS — Z7902 Long term (current) use of antithrombotics/antiplatelets: Secondary | ICD-10-CM | POA: Insufficient documentation

## 2017-04-13 DIAGNOSIS — I10 Essential (primary) hypertension: Secondary | ICD-10-CM | POA: Insufficient documentation

## 2017-04-13 DIAGNOSIS — Z7982 Long term (current) use of aspirin: Secondary | ICD-10-CM | POA: Insufficient documentation

## 2017-04-13 DIAGNOSIS — Z955 Presence of coronary angioplasty implant and graft: Secondary | ICD-10-CM | POA: Insufficient documentation

## 2017-04-13 DIAGNOSIS — Z87891 Personal history of nicotine dependence: Secondary | ICD-10-CM | POA: Insufficient documentation

## 2017-04-13 DIAGNOSIS — Z79899 Other long term (current) drug therapy: Secondary | ICD-10-CM | POA: Insufficient documentation

## 2017-04-13 DIAGNOSIS — I200 Unstable angina: Secondary | ICD-10-CM | POA: Diagnosis present

## 2017-04-13 DIAGNOSIS — I209 Angina pectoris, unspecified: Secondary | ICD-10-CM

## 2017-04-13 DIAGNOSIS — I252 Old myocardial infarction: Secondary | ICD-10-CM | POA: Insufficient documentation

## 2017-04-13 DIAGNOSIS — I2510 Atherosclerotic heart disease of native coronary artery without angina pectoris: Secondary | ICD-10-CM | POA: Diagnosis present

## 2017-04-13 DIAGNOSIS — I25110 Atherosclerotic heart disease of native coronary artery with unstable angina pectoris: Secondary | ICD-10-CM | POA: Insufficient documentation

## 2017-04-13 HISTORY — PX: CORONARY STENT INTERVENTION: CATH118234

## 2017-04-13 HISTORY — PX: CARDIAC CATHETERIZATION: SHX172

## 2017-04-13 HISTORY — PX: LEFT HEART CATH AND CORONARY ANGIOGRAPHY: CATH118249

## 2017-04-13 LAB — POCT ACTIVATED CLOTTING TIME
Activated Clotting Time: 373 seconds
Activated Clotting Time: 252 seconds

## 2017-04-13 SURGERY — LEFT HEART CATH AND CORONARY ANGIOGRAPHY
Anesthesia: LOCAL

## 2017-04-13 MED ORDER — SODIUM CHLORIDE 0.9 % WEIGHT BASED INFUSION
3.0000 mL/kg/h | INTRAVENOUS | Status: DC
  Administered 2017-04-13: 3 mL/kg/h via INTRAVENOUS

## 2017-04-13 MED ORDER — ACETAMINOPHEN 325 MG PO TABS: 650.0000 mg | ORAL_TABLET | ORAL | Status: DC | PRN

## 2017-04-13 MED ORDER — FENTANYL CITRATE (PF) 100 MCG/2ML IJ SOLN
INTRAMUSCULAR | Status: DC | PRN
  Administered 2017-04-13 (×2): 25 ug via INTRAVENOUS

## 2017-04-13 MED ORDER — SODIUM CHLORIDE 0.9% FLUSH: 3.0000 mL | Freq: Two times a day (BID) | INTRAVENOUS | Status: DC

## 2017-04-13 MED ORDER — NITROGLYCERIN 1 MG/10 ML FOR IR/CATH LAB
INTRA_ARTERIAL | Status: AC
  Filled 2017-04-13: qty 10

## 2017-04-13 MED ORDER — HYDRALAZINE HCL 20 MG/ML IJ SOLN: 5.0000 mg | INTRAMUSCULAR | Status: AC | PRN

## 2017-04-13 MED ORDER — SODIUM CHLORIDE 0.9 % IV SOLN: 250.0000 mL | INTRAVENOUS | Status: DC | PRN

## 2017-04-13 MED ORDER — ASPIRIN 81 MG PO CHEW
81.0000 mg | CHEWABLE_TABLET | Freq: Every day | ORAL | Status: DC
  Administered 2017-04-14: 81 mg via ORAL
  Filled 2017-04-13: qty 1

## 2017-04-13 MED ORDER — LIDOCAINE HCL (PF) 1 % IJ SOLN
INTRAMUSCULAR | Status: AC
  Filled 2017-04-13: qty 30

## 2017-04-13 MED ORDER — IOPAMIDOL (ISOVUE-370) INJECTION 76%
INTRAVENOUS | Status: AC
  Filled 2017-04-13: qty 50

## 2017-04-13 MED ORDER — HEPARIN SODIUM (PORCINE) 1000 UNIT/ML IJ SOLN
INTRAMUSCULAR | Status: DC | PRN
  Administered 2017-04-13: 5000 [IU] via INTRAVENOUS
  Administered 2017-04-13: 3000 [IU] via INTRAVENOUS
  Administered 2017-04-13: 5000 [IU] via INTRAVENOUS

## 2017-04-13 MED ORDER — SODIUM CHLORIDE 0.9% FLUSH
3.0000 mL | INTRAVENOUS | Status: DC | PRN
Start: 2017-04-13 — End: 2017-04-13

## 2017-04-13 MED ORDER — LABETALOL HCL 5 MG/ML IV SOLN: 10.0000 mg | INTRAVENOUS | Status: AC | PRN

## 2017-04-13 MED ORDER — LIDOCAINE HCL (PF) 1 % IJ SOLN
INTRAMUSCULAR | Status: DC | PRN
  Administered 2017-04-13: 5 mL

## 2017-04-13 MED ORDER — IOPAMIDOL (ISOVUE-370) INJECTION 76%
INTRAVENOUS | Status: DC | PRN
  Administered 2017-04-13: 195 mL via INTRA_ARTERIAL

## 2017-04-13 MED ORDER — HEPARIN (PORCINE) IN NACL 2-0.9 UNIT/ML-% IJ SOLN
INTRAMUSCULAR | Status: AC | PRN
  Administered 2017-04-13: 1000 mL

## 2017-04-13 MED ORDER — ANGIOPLASTY BOOK
Freq: Once | Status: AC
  Administered 2017-04-13: 1
  Filled 2017-04-13: qty 1

## 2017-04-13 MED ORDER — HYDROCHLOROTHIAZIDE 12.5 MG PO CAPS
12.5000 mg | ORAL_CAPSULE | Freq: Every day | ORAL | Status: DC
  Administered 2017-04-13 – 2017-04-14 (×2): 12.5 mg via ORAL
  Filled 2017-04-13 (×2): qty 1

## 2017-04-13 MED ORDER — IOPAMIDOL (ISOVUE-370) INJECTION 76%
INTRAVENOUS | Status: AC
  Filled 2017-04-13: qty 100

## 2017-04-13 MED ORDER — NITROGLYCERIN 1 MG/10 ML FOR IR/CATH LAB
INTRA_ARTERIAL | Status: DC | PRN
  Administered 2017-04-13: 200 ug via INTRACORONARY

## 2017-04-13 MED ORDER — MIDAZOLAM HCL 2 MG/2ML IJ SOLN
INTRAMUSCULAR | Status: AC
  Filled 2017-04-13: qty 2

## 2017-04-13 MED ORDER — ONDANSETRON HCL 4 MG/2ML IJ SOLN: 4.0000 mg | Freq: Four times a day (QID) | INTRAMUSCULAR | Status: DC | PRN

## 2017-04-13 MED ORDER — SODIUM CHLORIDE 0.9% FLUSH
3.0000 mL | Freq: Two times a day (BID) | INTRAVENOUS | Status: DC
  Administered 2017-04-13: 18:00:00 3 mL via INTRAVENOUS

## 2017-04-13 MED ORDER — VERAPAMIL HCL 2.5 MG/ML IV SOLN
INTRAVENOUS | Status: AC
Start: 2017-04-13 — End: 2017-04-13
  Filled 2017-04-13: qty 2

## 2017-04-13 MED ORDER — CLOPIDOGREL BISULFATE 75 MG PO TABS
75.0000 mg | ORAL_TABLET | Freq: Every day | ORAL | Status: DC
Start: 2017-04-14 — End: 2017-04-14
  Administered 2017-04-14: 75 mg via ORAL
  Filled 2017-04-13: qty 1

## 2017-04-13 MED ORDER — AMLODIPINE BESYLATE 5 MG PO TABS
5.0000 mg | ORAL_TABLET | Freq: Every day | ORAL | Status: DC
  Administered 2017-04-13 – 2017-04-14 (×2): 5 mg via ORAL
  Filled 2017-04-13 (×2): qty 1

## 2017-04-13 MED ORDER — SODIUM CHLORIDE 0.9 % WEIGHT BASED INFUSION: 1.0000 mL/kg/h | INTRAVENOUS | Status: DC

## 2017-04-13 MED ORDER — MIDAZOLAM HCL 2 MG/2ML IJ SOLN
INTRAMUSCULAR | Status: DC | PRN
  Administered 2017-04-13 (×2): 1 mg via INTRAVENOUS
  Administered 2017-04-13: 2 mg via INTRAVENOUS

## 2017-04-13 MED ORDER — HEPARIN SODIUM (PORCINE) 1000 UNIT/ML IJ SOLN
INTRAMUSCULAR | Status: AC
  Filled 2017-04-13: qty 1

## 2017-04-13 MED ORDER — NITROGLYCERIN 0.4 MG SL SUBL: 0.4000 mg | SUBLINGUAL_TABLET | SUBLINGUAL | Status: DC | PRN

## 2017-04-13 MED ORDER — SODIUM CHLORIDE 0.9% FLUSH: 3.0000 mL | INTRAVENOUS | Status: DC | PRN

## 2017-04-13 MED ORDER — ASPIRIN 81 MG PO CHEW: 81.0000 mg | CHEWABLE_TABLET | ORAL | Status: DC

## 2017-04-13 MED ORDER — LOSARTAN POTASSIUM 50 MG PO TABS
50.0000 mg | ORAL_TABLET | Freq: Every day | ORAL | Status: DC
  Administered 2017-04-13 – 2017-04-14 (×2): 50 mg via ORAL
  Filled 2017-04-13 (×2): qty 1

## 2017-04-13 MED ORDER — METOPROLOL TARTRATE 25 MG PO TABS
50.0000 mg | ORAL_TABLET | Freq: Two times a day (BID) | ORAL | Status: DC
Start: 2017-04-13 — End: 2017-04-14
  Administered 2017-04-13 – 2017-04-14 (×2): 50 mg via ORAL
  Filled 2017-04-13 (×3): qty 2

## 2017-04-13 MED ORDER — SODIUM CHLORIDE 0.9 % WEIGHT BASED INFUSION
1.0000 mL/kg/h | INTRAVENOUS | Status: AC
  Administered 2017-04-13: 1 mL/kg/h via INTRAVENOUS

## 2017-04-13 MED ORDER — ATORVASTATIN CALCIUM 40 MG PO TABS
40.0000 mg | ORAL_TABLET | Freq: Every day | ORAL | Status: DC
Start: 2017-04-13 — End: 2017-04-14
  Administered 2017-04-13: 21:00:00 40 mg via ORAL
  Filled 2017-04-13: qty 1

## 2017-04-13 MED ORDER — VERAPAMIL HCL 2.5 MG/ML IV SOLN
INTRAVENOUS | Status: DC | PRN
  Administered 2017-04-13: 10 mL via INTRA_ARTERIAL

## 2017-04-13 MED ORDER — HEPARIN (PORCINE) IN NACL 2-0.9 UNIT/ML-% IJ SOLN
INTRAMUSCULAR | Status: AC
  Filled 2017-04-13: qty 1000

## 2017-04-13 MED ORDER — FENTANYL CITRATE (PF) 100 MCG/2ML IJ SOLN
INTRAMUSCULAR | Status: AC
  Filled 2017-04-13: qty 2

## 2017-04-13 SURGICAL SUPPLY — 23 items
BALLN EUPHORA RX 2.5X12 (BALLOONS) ×2
BALLN SAPPHIRE 2.5X12 (BALLOONS) ×2
BALLN SAPPHIRE 3.0X12 (BALLOONS) ×2
BALLN SAPPHIRE ~~LOC~~ 2.75X12 (BALLOONS) ×1 IMPLANT
BALLN SAPPHIRE ~~LOC~~ 4.0X12 (BALLOONS) ×1 IMPLANT
BALLOON EUPHORA RX 2.5X12 (BALLOONS) IMPLANT
BALLOON SAPPHIRE 2.5X12 (BALLOONS) IMPLANT
BALLOON SAPPHIRE 3.0X12 (BALLOONS) IMPLANT
CATH IMPULSE 5F ANG/FL3.5 (CATHETERS) ×1 IMPLANT
CATH LAUNCHER 6FR EBU 4 (CATHETERS) ×1 IMPLANT
DEVICE RAD COMP TR BAND LRG (VASCULAR PRODUCTS) ×1 IMPLANT
GLIDESHEATH SLEND SS 6F .021 (SHEATH) ×1 IMPLANT
GUIDEWIRE INQWIRE 1.5J.035X260 (WIRE) IMPLANT
INQWIRE 1.5J .035X260CM (WIRE) ×2
KIT ENCORE 26 ADVANTAGE (KITS) ×1 IMPLANT
KIT HEART LEFT (KITS) ×2 IMPLANT
PACK CARDIAC CATHETERIZATION (CUSTOM PROCEDURE TRAY) ×2 IMPLANT
STENT SYNERGY DES 2.5X20 (Permanent Stent) ×1 IMPLANT
STENT SYNERGY DES 3.5X16 (Permanent Stent) ×1 IMPLANT
SYR MEDRAD MARK V 150ML (SYRINGE) ×2 IMPLANT
TRANSDUCER W/STOPCOCK (MISCELLANEOUS) ×2 IMPLANT
TUBING CIL FLEX 10 FLL-RA (TUBING) ×2 IMPLANT
WIRE ASAHI PROWATER 180CM (WIRE) ×1 IMPLANT

## 2017-04-13 NOTE — Progress Notes (Signed)
TR BAND REMOVAL  LOCATION:    right radial  DEFLATED PER PROTOCOL:    Yes.    TIME BAND OFF / DRESSING APPLIED:    19:00   SITE UPON ARRIVAL:    Level 0  SITE AFTER BAND REMOVAL:    Level 0  CIRCULATION SENSATION AND MOVEMENT:    Within Normal Limits   Yes.    COMMENTS:   Post TR band instructions given. Pt tolerated well. 

## 2017-04-13 NOTE — Research (Signed)
[place] Study Informed Consent   Subject Name: Ernest Bishop  Subject met inclusion and exclusion criteria.  The informed consent form, study requirements and expectations were reviewed with the subject and questions and concerns were addressed prior to the signing of the consent form.  The subject verbalized understanding of the trial requirements.  The subject agreed to participate in the OPTIMIZE trial and signed the informed consent.  The informed consent was obtained prior to performance of any protocol-specific procedures for the subject.  A copy of the signed informed consent was given to the subject and a copy was placed in the subject's medical record.  The patient will only be enrolled if angiographic criteria is met.  Blossom Hoops 04/13/2017, 1:09 PM

## 2017-04-13 NOTE — Interval H&P Note (Signed)
History and Physical Interval Note:  04/13/2017 1:17 PM  Ernest PolingKevin L   has presented today for surgery, with the diagnosis of cp  The various methods of treatment have been discussed with the patient and family. After consideration of risks, benefits and other options for treatment, the patient has consented to  Procedure(s): LEFT HEART CATH AND CORONARY ANGIOGRAPHY (N/A) as a surgical intervention .  The patient's history has been reviewed, patient examined, no change in status, stable for surgery.  I have reviewed the patient's chart and labs.  Questions were answered to the patient's satisfaction.   Cath Lab Visit (complete for each Cath Lab visit)  Clinical Evaluation Leading to the Procedure:   ACS: No.  Non-ACS:    Anginal Classification: CCS III  Anti-ischemic medical therapy: Maximal Therapy (2 or more classes of medications)  Non-Invasive Test Results: No non-invasive testing performed  Prior CABG: No previous CABG        Theron AristaPeter [facility] PointJordanMD,FACC 04/13/2017 1:17 PM

## 2017-04-14 ENCOUNTER — Other Ambulatory Visit: Payer: Self-pay

## 2017-04-14 ENCOUNTER — Ambulatory Visit: Payer: Self-pay | Admitting: Physician Assistant

## 2017-04-14 ENCOUNTER — Encounter (HOSPITAL_COMMUNITY): Payer: Self-pay | Admitting: Cardiology

## 2017-04-14 LAB — CBC
HEMATOCRIT: 43 % (ref 39.0–52.0)
HEMOGLOBIN: 14.2 g/dL (ref 13.0–17.0)
MCH: 31 pg (ref 26.0–34.0)
MCHC: 33 g/dL (ref 30.0–36.0)
MCV: 93.9 fL (ref 78.0–100.0)
Platelets: 229 10*3/uL (ref 150–400)
RBC: 4.58 MIL/uL (ref 4.22–5.81)
RDW: 12.3 % (ref 11.5–15.5)
WBC: 10.5 10*3/uL (ref 4.0–10.5)

## 2017-04-14 LAB — BASIC METABOLIC PANEL
ANION GAP: 8 (ref 5–15)
BUN: 10 mg/dL (ref 6–20)
CALCIUM: 8.9 mg/dL (ref 8.9–10.3)
CHLORIDE: 107 mmol/L (ref 101–111)
CO2: 21 mmol/L — ABNORMAL LOW (ref 22–32)
Creatinine, Ser: 0.82 mg/dL (ref 0.61–1.24)
GFR calc non Af Amer: >60 mL/min (ref >60)
Glucose, Bld: 100 mg/dL — ABNORMAL HIGH (ref 65–99)
POTASSIUM: 4.2 mmol/L (ref 3.5–5.1)
Sodium: 136 mmol/L (ref 135–145)

## 2017-04-14 NOTE — Progress Notes (Signed)
CARDIAC REHAB PHASE I   PRE:  Rate/Rhythm: 69 SR    BP: sitting 145/91    SaO2:   MODE:  Ambulation: 1000 ft   POST:  Rate/Rhythm: 95 SR    BP: sitting 205/101, 30 min later 156/100     SaO2:   Pt able to walk without SOB or angina, much improved. BP significantly elevated after walk. He tends to be "revved up" individual, very talkative. Ed completed with pt and wife. Good comprehension and pt is eager to make change because he wants to avoid CABG. [facility] [place] [place] and does not have insurance so CRPII is difficult. I will send referral to [place] and he might be able to do Maintenance program. He understands importance of Plavix/ASA. [phone]   Harriet MassonRandi Kristan Keiyana Stehr CES, ACSM 04/14/2017 9:14 AM

## 2017-04-14 NOTE — Discharge Summary (Signed)
Discharge Summary    Patient ID: Ernest Bishop,  MRN: [id number], DOB/AGE: 56/04/1960 57 y.o.  Admit date: 04/13/2017 Discharge date: 04/14/2017  Primary Care Provider: Nelwyn SalisburyFry, Stephen A Primary Cardiologist: Delton SeeNelson  Discharge Diagnoses    Principal Problem:   Angina pectoris [facility](HCC) Active Problems:   CAD (coronary artery disease)   Hypertension   Dyslipidemia   Unstable angina (HCC)   Allergies Allergies  Allergen Reactions  . Codeine Itching  . Hydrocodone Itching    Diagnostic Studies/Procedures    Cath: 04/13/17  Conclusion     Prox RCA to Mid RCA lesion is 100% stenosed.  Ost Cx lesion is 50% stenosed.  Mid Cx lesion is 90% stenosed.  A drug-eluting stent was successfully placed using a STENT SYNERGY DES 3.5X16.  Post intervention, there is a 0% residual stenosis.  Ost 1st Mrg to 1st Mrg lesion is 95% stenosed.  Post intervention, there is a 0% residual stenosis.  A drug-eluting stent was successfully placed using a STENT SYNERGY DES 2.5X20.  Ost 1st Diag lesion is 90% stenosed.  The left ventricular systolic function is normal.  LV end diastolic pressure is normal.  The left ventricular ejection fraction is 55-65% by visual estimate.   1. 2 vessel obstructive CAD    - 90% ostial first diagonal    - 90% ulcerative mid LCx just past the first OM    - 95% first OM both in stent and distal to prior stent    - patent stent in the distal LCx    - 100% CTO of the nondominant RCA 2. Good LV function 3. Normal LVEDP 4. Successful stenting of the first OM with DES 5. Successful stenting of the mid LCx with DES  Plan: DAPT indefinitely. Anticipate DC in am. I would anticipate significant improvement in angina. If he still has limiting angina would consider PCI of the first diagonal.    _____________   History of Present Illness     57 yo male with PMH who is followed by Dr. Delton SeeNelson and has known CAD, status post non-STEMI in 9/12 treated with  DES to the first obtuse marginal. He had a chronically occluded RCA at that time that was tx medically. He underwent PCI again in 09/2012 in [place]. Records of Cardiac Catheterization done in [facility] are not located in his chart. Notes indicated that he had a DES placed to the first obtuse marginal and distal circumflex.  He was seen by Tereso NewcomerScott Weaver, PA-C, on 02/02/17 and complained of exertional CP c/w previous anginal symptoms, relieved with rest and SL NTG. He was ordered to get a LHC and his antianginals were also adjusted. Metoprolol was increased to 50 mg BID. Pt did not schedule LHC at that time due to lack of insurance. He also noted that he had a good initial response after increase in metoprolol. However, he has had recurrent symptoms. He continued to have exertional left sided chest pain radiating to his left neck and arm. Symptoms resolve with rest. He denied symptoms at rest. He also noted new fatigue. He was currently CP free at his office visit and wanted to schedule LHC.    Hospital Course     Underwent cardiac cath noted above with successful PCI/DES to the OM (3rd stent), and LCx. Normal EF on LV gram. No complications noted post cath. Morning labs are stable. Will plan for DAPT indefinitely. No recurrent chest pain. Worked well with cardiac rehab.   General: Well  developed, well nourished, male appearing in no acute distress. Head: Normocephalic, atraumatic.  Neck: Supple without bruits, JVD. Lungs:  Resp regular and unlabored, CTA. Heart: RRR, S1, S2, no S3, S4, or murmur; no rub. Abdomen: Soft, non-tender, non-distended with normoactive bowel sounds. No hepatomegaly. No rebound/guarding. No obvious abdominal masses. Extremities: No clubbing, cyanosis, edema. Distal pedal pulses are 2+ bilaterally. R radial cath site stable without bruising or hematoma Neuro: Alert and oriented X 3. Moves all extremities spontaneously. Psych: Normal affect.  Ernest Bishop was seen by  Dr. Excell Seltzer and determined stable for discharge home. Follow up in the office has been arranged. Medications are listed below.   _____________  Discharge Vitals Blood pressure (!) 145/91, pulse 69, temperature 98.7 F (37.1 C), temperature source Oral, resp. rate 20, height 5\' 10"  (1.778 m), weight 230 lb 13.2 oz (104.7 kg), SpO2 97 %.  Filed Weights   04/13/17 1134 04/14/17 0524  Weight: 230 lb (104.3 kg) 230 lb 13.2 oz (104.7 kg)    Labs & Radiologic Studies    CBC Recent Labs    04/14/17 0456  WBC 10.5  HGB 14.2  HCT 43.0  MCV 93.9  PLT 229   Basic Metabolic Panel Recent Labs    40/98/11 0456  NA 136  K 4.2  CL 107  CO2 21*  GLUCOSE 100*  BUN 10  CREATININE 0.82  CALCIUM 8.9   Liver Function Tests No results for input(s): AST, ALT, ALKPHOS, BILITOT, PROT, ALBUMIN in the last 72 hours. No results for input(s): LIPASE, AMYLASE in the last 72 hours. Cardiac Enzymes No results for input(s): CKTOTAL, CKMB, CKMBINDEX, TROPONINI in the last 72 hours. BNP Invalid input(s): POCBNP D-Dimer No results for input(s): DDIMER in the last 72 hours. Hemoglobin A1C No results for input(s): HGBA1C in the last 72 hours. Fasting Lipid Panel No results for input(s): CHOL, HDL, LDLCALC, TRIG, CHOLHDL, LDLDIRECT in the last 72 hours. Thyroid Function Tests No results for input(s): TSH, T4TOTAL, T3FREE, THYROIDAB in the last 72 hours.  Invalid input(s): FREET3 _____________  No results found. Disposition   Pt is being discharged home today in good condition.  Follow-up Plans & Appointments    Follow-up Information    Beatrice Lecher, PA-C Follow up on 04/28/2017.   Specialties:  Cardiology, Physician Assistant Why:  at 10:45am for your follow up appt.  Contact information: [address] [place] [address] [phone]          Discharge Instructions    AMB Referral to Cardiac Rehabilitation - Phase II   Complete by:  As directed     Diagnosis:  Coronary Stents   Call MD for:  redness, tenderness, or signs of infection (pain, swelling, redness, odor or green/yellow discharge around incision site)   Complete by:  As directed    Diet - low sodium heart healthy   Complete by:  As directed    Discharge instructions   Complete by:  As directed    Radial Site Care Refer to this sheet in the next few weeks. These instructions provide you with information on caring for yourself after your procedure. Your caregiver may also give you more specific instructions. Your treatment has been planned according to current medical practices, but problems sometimes occur. Call your caregiver if you have any problems or questions after your procedure. HOME CARE INSTRUCTIONS You may shower the day after the procedure.Remove the bandage (dressing) and gently wash the site with plain soap and  water.Gently pat the site dry.  Do not apply powder or lotion to the site.  Do not submerge the affected site in water for 3 to 5 days.  Inspect the site at least twice daily.  Do not flex or bend the affected arm for 24 hours.  No lifting over 5 pounds (2.3 kg) for 5 days after your procedure.  Do not drive home if you are discharged the same day of the procedure. Have someone else drive you.  You may drive 24 hours after the procedure unless otherwise instructed by your caregiver.  What to expect: Any bruising will usually fade within 1 to 2 weeks.  Blood that collects in the tissue (hematoma) may be painful to the touch. It should usually decrease in size and tenderness within 1 to 2 weeks.  SEEK IMMEDIATE MEDICAL CARE IF: You have unusual pain at the radial site.  You have redness, warmth, swelling, or pain at the radial site.  You have drainage (other than a small amount of blood on the dressing).  You have chills.  You have a fever or persistent symptoms for more than 72 hours.  You have a fever and your symptoms suddenly get worse.  Your arm  becomes pale, cool, tingly, or numb.  You have heavy bleeding from the site. Hold pressure on the site.   PLEASE DO NOT MISS ANY DOSES OF YOUR PLAVIX!!!!! Also keep a log of you blood pressures and bring back to your follow up appt. Please call the office with any questions.   Patients taking blood thinners should generally stay away from medicines like ibuprofen, Advil, Motrin, naproxen, and Aleve due to risk of stomach bleeding. You may take Tylenol as directed or talk to your primary doctor about alternatives.   Increase activity slowly   Complete by:  As directed       Discharge Medications     Medication List    TAKE these medications   amLODipine 5 MG tablet Commonly known as:  NORVASC Take 1 tablet (5 mg total) by mouth daily.   aspirin 81 MG chewable tablet Chew 81 mg by mouth daily.   atorvastatin 40 MG tablet Commonly known as:  LIPITOR Take 1 tablet (40 mg total) by mouth at bedtime.   clopidogrel 75 MG tablet Commonly known as:  PLAVIX Take 1 tablet (75 mg total) by mouth daily.   hydrochlorothiazide 12.5 MG capsule Commonly known as:  MICROZIDE Take 1 capsule (12.5 mg total) by mouth daily.   losartan 50 MG tablet Commonly known as:  COZAAR TAKE ONE TABLET BY MOUTH EVERY DAY   metoprolol tartrate 50 MG tablet Commonly known as:  LOPRESSOR Take 1 tablet (50 mg total) by mouth 2 (two) times daily.   nitroGLYCERIN 0.4 MG SL tablet Commonly known as:  NITROSTAT Place 1 tablet (0.4 mg total) under the tongue every 5 (five) minutes as needed for chest pain.        Aspirin prescribed at discharge?  Yes High Intensity Statin Prescribed? (Lipitor 40-80mg  or Crestor 20-40mg ): Yes Beta Blocker Prescribed? Yes For EF <40%, was ACEI/ARB Prescribed? Yes ADP Receptor Inhibitor Prescribed? (i.e. Plavix etc.-Includes Medically Managed Patients): Yes For EF <40%, Aldosterone Inhibitor Prescribed? No: EF ok Was EF assessed during THIS hospitalization? Yes Was  Cardiac Rehab II ordered? (Included Medically managed Patients): Yes   Outstanding Labs/Studies   N/a   Duration of Discharge Encounter   Greater than 30 minutes including physician time.  Signed, Laverda Page NP-C  04/14/2017, 9:18 AM  Patient seen, examined. Available data reviewed. Agree with findings, assessment, and plan as outlined by Laverda Page, NP-C. On my exam: Vitals:   04/14/17 0524 04/14/17 0700  BP: 126/66 (!) 145/91  Pulse: (!) 57 69  Resp: 18 20  Temp: 98.3 F (36.8 C) 98.7 F (37.1 C)  SpO2: 98% 97%   Pt is alert and oriented, NAD HEENT: normal Neck: JVP - normal Lungs: CTA bilaterally CV: RRR without murmur or gallop Abd: soft, NT, Positive BS, no hepatomegaly Ext: no C/C/E, distal pulses intact and equal. Radial site clear Skin: warm/dry no rash  Cath films reviewed. Discussed importance of medication adherence, lifestyle modification with diet and exercise. He is motivated. Outpatient FU arranged. Labs/EKG reviewed. Ready for DC.  Tonny Bollman, M.D. 04/14/2017 10:00 AM

## 2017-04-16 ENCOUNTER — Telehealth (HOSPITAL_COMMUNITY): Payer: Self-pay

## 2017-04-16 NOTE — Telephone Encounter (Signed)
Referral received and verified for MD signature. Follow up appt is on 04/28/2017. Patient is Medicaid Potential. Will call to see if interested in the Maintenance program.

## 2017-04-16 NOTE — Telephone Encounter (Signed)
Called and spoke with patient in regards to Insurance - Patient is not insured. I offered Maintenance program and patient cannot afford that. Closed referral.

## 2017-04-19 ENCOUNTER — Other Ambulatory Visit: Payer: Self-pay | Admitting: *Deleted

## 2017-04-19 ENCOUNTER — Other Ambulatory Visit: Payer: Self-pay | Admitting: Cardiology

## 2017-04-19 MED ORDER — HYDROCHLOROTHIAZIDE 12.5 MG PO CAPS: 12.5000 mg | ORAL_CAPSULE | Freq: Every day | ORAL | 3 refills | Status: DC

## 2017-04-22 ENCOUNTER — Ambulatory Visit: Payer: Self-pay | Admitting: Cardiology

## 2017-04-27 ENCOUNTER — Other Ambulatory Visit: Payer: Self-pay

## 2017-04-27 MED ORDER — ATORVASTATIN CALCIUM 40 MG PO TABS: 40.0000 mg | ORAL_TABLET | Freq: Every day | ORAL | 3 refills | Status: DC

## 2017-04-28 ENCOUNTER — Ambulatory Visit: Payer: Self-pay | Admitting: Physician Assistant

## 2017-06-16 ENCOUNTER — Other Ambulatory Visit: Payer: Self-pay | Admitting: Cardiology

## 2017-06-16 DIAGNOSIS — I2510 Atherosclerotic heart disease of native coronary artery without angina pectoris: Secondary | ICD-10-CM

## 2017-06-16 DIAGNOSIS — E785 Hyperlipidemia, unspecified: Secondary | ICD-10-CM

## 2017-06-16 DIAGNOSIS — I2583 Coronary atherosclerosis due to lipid rich plaque: Secondary | ICD-10-CM

## 2017-06-16 DIAGNOSIS — I10 Essential (primary) hypertension: Secondary | ICD-10-CM

## 2017-06-16 MED ORDER — CLOPIDOGREL BISULFATE 75 MG PO TABS: 75.0000 mg | ORAL_TABLET | Freq: Every day | ORAL | 2 refills | Status: DC

## 2017-06-16 MED ORDER — AMLODIPINE BESYLATE 5 MG PO TABS: 5.0000 mg | ORAL_TABLET | Freq: Every day | ORAL | 2 refills | Status: DC

## 2018-03-28 ENCOUNTER — Other Ambulatory Visit: Payer: Self-pay | Admitting: Cardiology

## 2018-03-28 MED ORDER — METOPROLOL TARTRATE 50 MG PO TABS: 50.0000 mg | ORAL_TABLET | Freq: Two times a day (BID) | ORAL | 0 refills | Status: DC

## 2018-05-25 ENCOUNTER — Other Ambulatory Visit: Payer: Self-pay | Admitting: Cardiology

## 2018-05-25 MED ORDER — HYDROCHLOROTHIAZIDE 12.5 MG PO CAPS: 12.5000 mg | ORAL_CAPSULE | Freq: Every day | ORAL | 0 refills | Status: DC

## 2018-05-25 MED ORDER — LOSARTAN POTASSIUM 50 MG PO TABS: 50.0000 mg | ORAL_TABLET | Freq: Every day | ORAL | 0 refills | Status: DC

## 2018-06-07 ENCOUNTER — Ambulatory Visit: Payer: Self-pay | Admitting: Physician Assistant

## 2018-07-03 ENCOUNTER — Telehealth: Payer: Self-pay | Admitting: Physician Assistant

## 2018-07-03 NOTE — Telephone Encounter (Signed)
Discussed changing appt to Telehealth due to COVID-19. He is agreeable to a video visit.  PLAN:  1. Please call on 3/30 and go over instructions and make sure consent obtained.  I sent message via MyChart.  Tereso Newcomer, PA-C    07/03/2018 3:06 PM

## 2018-07-05 ENCOUNTER — Telehealth (INDEPENDENT_AMBULATORY_CARE_PROVIDER_SITE_OTHER): Payer: Self-pay | Admitting: Physician Assistant

## 2018-07-05 ENCOUNTER — Encounter: Payer: Self-pay | Admitting: Physician Assistant

## 2018-07-05 ENCOUNTER — Other Ambulatory Visit: Payer: Self-pay

## 2018-07-05 VITALS — BP 148/81 | HR 60 | Ht 70.0 in | Wt 234.0 lb

## 2018-07-05 DIAGNOSIS — E785 Hyperlipidemia, unspecified: Secondary | ICD-10-CM

## 2018-07-05 DIAGNOSIS — I25119 Atherosclerotic heart disease of native coronary artery with unspecified angina pectoris: Secondary | ICD-10-CM

## 2018-07-05 DIAGNOSIS — I252 Old myocardial infarction: Secondary | ICD-10-CM

## 2018-07-05 DIAGNOSIS — I10 Essential (primary) hypertension: Secondary | ICD-10-CM

## 2018-07-05 DIAGNOSIS — Z955 Presence of coronary angioplasty implant and graft: Secondary | ICD-10-CM

## 2018-07-05 DIAGNOSIS — R42 Dizziness and giddiness: Secondary | ICD-10-CM

## 2018-07-05 MED ORDER — AMLODIPINE BESYLATE 10 MG PO TABS: 10.0000 mg | ORAL_TABLET | Freq: Every day | ORAL | 3 refills | Status: DC

## 2018-07-05 NOTE — Patient Instructions (Signed)
Medication Instructions:  Increase your Amlodipine to 10 mg once daily (you can take 2 of the 5 mg tablets to = 10 mg) A prescription for the 10 mg tablets has been sent to your pharmacy.  If you need a refill on your cardiac medications before your next appointment, please call your pharmacy.   Lab work: None  If you have labs (blood work) drawn today and your tests are completely normal, you will receive your results only by: Marland Kitchen MyChart Message (if you have MyChart) OR . A paper copy in the mail If you have any lab test that is abnormal or we need to change your treatment, we will call you to review the results.  Testing/Procedures: None   Follow-Up: At [facility], you and your health needs are our priority.  As part of our continuing mission to provide you with exceptional heart care, we have created designated Provider Care Teams.  These Care Teams include your primary Cardiologist (physician) and Advanced Practice Providers (APPs -  Physician Assistants and Nurse Practitioners) who all work together to provide you with the care you need, when you need it. You will need a follow up appointment in:  1 year.  Please call our office 2 months in advance to schedule this appointment.  You may see Tobias Alexander, MD  or Tereso Newcomer, PA-C  Any Other Special Instructions Will Be Listed Below (If Applicable). Keep track of your blood pressure Call us if it is consistently > 130/80.  If you have more dizzy spells, call so we can arrange a heart monitor to wear at home.

## 2018-07-05 NOTE — Progress Notes (Signed)
Virtual Visit via Video Note    Evaluation Performed:  Follow-up visit  This visit type was conducted due to national recommendations for restrictions regarding the COVID-19 Pandemic (e.g. social distancing).  This format is felt to be most appropriate for this patient at this time.  All issues noted in this document were discussed and addressed.  No physical exam was performed (except for noted visual exam findings with Video Visits).  Please refer to the patient's chart (MyChart message for video visits and phone note for telephone visits) for the patient's consent to telehealth for [facility].  Date:  07/05/2018   ID:  Ernest Bishop, DOB 1960-08-18, MRN [id number]  Patient Location:  Home  Provider location:   Home  PCP:  Nelwyn Salisbury, MD  Cardiologist:  Tobias Alexander, MD   Electrophysiologist:  None   Chief Complaint: Follow-up for coronary artery disease  History of Present Illness:    Ernest Bishop is a 58 y.o. male who presents via audio/video conferencing for a telehealth visit today.    He has coronary artery disease s/p NSTEMI in 9/12 tx with a DES to the OM1.  He has a known chronic occlusion of the RCA.  He underwent PCI in 6/14 in [place], [place] with a DES to the OM1 and distal LCx.  He was last seen in clinic in 01/2017. His last Cardiac Catheterization was in 04/2017 and demonstrated high grade disease in D1, mid LCx and OM1.  His stent in the distal LCx was patent and he had a known chronic occlusion of the RCA.  He underwent PCI with a DES to the OM1 and mid LCx.  PCI could be considered to D1 if he still had limiting angina.    Today he denies any recurrent symptoms of angina.  He denies shortness of breath, syncope, paroxysmal nocturnal dyspnea or lower extremity swelling.  He does note decreased energy.  He has disrupted sleep.  His wife says he snores sometimes.  He also notes an episode of vertigo recently.  He has a 0 turn mower.  It occurred while he  was mowing.  He describes a spinning sensation.  His heart rate was in the 40s and his blood pressure was 107 systolic.  He did feel somewhat nauseated with this.  The patient does not symptoms concerning for COVID-19 infection (fever, chills, cough, or new shortness of breath).    Prior CV studies:   The following studies were reviewed today:  Cardiac Catheterization 04/13/17 D1 ost 90 LCx ost 50, prox 90; OM1 ost 95 ISR RCA prox 100 - CTO w/ R-L collaterals EF 55-65 PCI:  3.5 x 16 mm Synergy DES to prox LCx; 2.5 x 20 mm Synergy DES to OM1        Echocardiogram 01/19/11 Mild LVH, EF 50-55, normal wall motion, normal diastolic function, mild LAE, trivial pericardial effusion  Cardiac Catheterization 01/02/11 LAD okay LCx mild plaque, OM1 99 (culprit) RCA mid 100-CTO R-R collaterals treated medically EF 40 PCI: 2.5 x 60 mm Promus element DES to the OM1  Past Medical History:  Diagnosis Date  . Alcohol use    Regular alcohol use, to cut down   . CAD (coronary artery disease)    Non-STEMI January 02, 2011, DES to OM, residual chronically occluded small RCA with collaterals 1/19 PCI/DES to pLcx, and OM (3rd stent), normal EF  . Dyslipidemia   . Ejection fraction    EF 40% at the time of acute  MI /  EF 55%, January 19, 2011, outpatient echo, no focal wall motion abnormalities.  Marland Kitchen GERD (gastroesophageal reflux disease)   . Hiatal hernia    history  . Hyperlipidemia   . Hypertension   . MI (myocardial infarction) [facility])    Past Surgical History:  Procedure Laterality Date  . CARDIAC CATHETERIZATION  04/13/2017  . CORONARY ANGIOPLASTY WITH STENT PLACEMENT  01/02/2011; 04/13/2017   Hattie Perch 01/03/2011;   . CORONARY STENT INTERVENTION N/A 04/13/2017   Procedure: CORONARY STENT INTERVENTION;  Surgeon: Swaziland, Peter M, MD;  Location: [facility] INVASIVE CV LAB;  Service: Cardiovascular;  Laterality: N/A;  . LEFT HEART CATH AND CORONARY ANGIOGRAPHY N/A 04/13/2017   Procedure: LEFT HEART CATH AND  CORONARY ANGIOGRAPHY;  Surgeon: Swaziland, Peter M, MD;  Location: [facility] INVASIVE CV LAB;  Service: Cardiovascular;  Laterality: N/A;  . SMALL INTESTINE SURGERY  1970   intestinal repair surgery after traumatic injury at the age of 7     Current Meds  Medication Sig  . aspirin 81 MG chewable tablet Chew 81 mg by mouth daily.  Marland Kitchen atorvastatin (LIPITOR) 40 MG tablet Take 1 tablet (40 mg total) by mouth at bedtime.  . clopidogrel (PLAVIX) 75 MG tablet Take 1 tablet (75 mg total) by mouth daily.  . hydrochlorothiazide (MICROZIDE) 12.5 MG capsule Take 1 capsule (12.5 mg total) by mouth daily. Please keep upcoming appt in March for future refills. Thank you  . losartan (COZAAR) 50 MG tablet Take 1 tablet (50 mg total) by mouth daily. Please keep upcoming appt in March for future refills. Thank you  . metoprolol tartrate (LOPRESSOR) 50 MG tablet Take 1 tablet (50 mg total) by mouth 2 (two) times daily. Please make yearly appt with Dr. Delton See for January before refills. 1st attempt  . nitroGLYCERIN (NITROSTAT) 0.4 MG SL tablet Place 1 tablet (0.4 mg total) under the tongue every 5 (five) minutes as needed for chest pain.  . [DISCONTINUED] amLODipine (NORVASC) 5 MG tablet Take 1 tablet (5 mg total) by mouth daily.     Allergies:   Codeine and Hydrocodone   Social History   Tobacco Use  . Smoking status: Former Games developer  . Smokeless tobacco: Never Used  Substance Use Topics  . Alcohol use: Yes  . Drug use: No     Family Hx: The patient's family history includes Alcohol abuse in his father; Arthritis in his mother; Hypertension in his brother and mother; Tremor in his mother.  ROS:   Please see the history of present illness.    All other systems reviewed and are negative.   Labs/Other Tests and Data Reviewed:    Recent Labs: No results found for requested labs within last 8760 hours.   Recent Lipid Panel Lab Results  Component Value Date/Time   CHOL 197 02/12/2016 01:56 PM   TRIG 96  02/12/2016 01:56 PM   HDL 44 02/12/2016 01:56 PM   CHOLHDL 4.5 02/12/2016 01:56 PM   LDLCALC 134 (H) 02/12/2016 01:56 PM    Wt Readings from Last 3 Encounters:  07/05/18 234 lb (106.1 kg)  04/14/17 230 lb 13.2 oz (104.7 kg)  04/09/17 230 lb (104.3 kg)     Objective:    Vital Signs:  BP (!) 148/81   Pulse 60   Ht 5\' 10"  (1.778 m)   Wt 234 lb (106.1 kg)   BMI 33.58 kg/m    On video image today, the patient appears to be a well nourished, well developed male in no  acute distress.   ASSESSMENT & PLAN:    Coronary artery disease involving native coronary artery of native heart with angina pectoris (HCC) History of non-STEMI in 9/12 treated with DES to the OM1.  Subsequently, he had a DES placed to the OM1 and distal circumflex in 09/2012.  His last cardiac catheterization was in January 2019 and he was noted to have high-grade disease in the proximal LCx and ostial OM1.  Both lesions were treated with drug-eluting stents.  He has residual disease in an ostial first diagonal that could be treated with PCI if he has refractory angina.  He has a known chronically occluded RCA.  His LV function was normal.  He has not had any recurrent anginal symptoms.  He does note decreased energy and also has disrupted sleep.  I have asked him to try to work on his sleep hygiene and to maintain regular exercise for at least 30 minutes every day.  Continue amlodipine, aspirin, atorvastatin, clopidogrel, metoprolol.  Follow-up 1 year.  Essential hypertension Blood pressure above target.  Increase amlodipine to 10 mg daily.  He will notify me if his blood pressure remains elevated.  Dyslipidemia Continue high intensity statin.  Dizziness He likely has some form of vertigo which has contributed to a vagal reaction.  This seems to only occur when he makes sudden turns on his 0 turn mower.  I have asked him to contact me if he has recurrent symptoms.  In the event that these continue or worsen, I would like  him to wear an event monitor to rule out arrhythmia for his symptoms.   COVID-19 Education: The signs and symptoms of COVID-19 were discussed with the patient and how to seek care for testing (follow up with PCP or arrange E-visit).  The importance of social distancing was discussed today.  Patient Risk:   After full review of this patient's clinical status, I feel that they are at least moderate risk at this time.  Time:   Today, I have spent 14 minutes with the patient with telehealth technology discussing the above problems.     Medication Adjustments/Labs and Tests Ordered: Current medicines are reviewed at length with the patient today.  Concerns regarding medicines are outlined above.  Tests Ordered: No orders of the defined types were placed in this encounter.  Medication Changes: Meds ordered this encounter  Medications  . amLODipine (NORVASC) 10 MG tablet    Sig: Take 1 tablet (10 mg total) by mouth daily.    Dispense:  90 tablet    Refill:  3    Dose Increase    Order Specific Question:   Supervising Provider    Answer:   Lewayne Bunting [[id number]]    Disposition:  Follow up in 1 year(s)  Signed, Tereso Newcomer, PA-C  07/05/2018 9:50 AM    [facility]

## 2018-08-15 ENCOUNTER — Other Ambulatory Visit: Payer: Self-pay | Admitting: Cardiology

## 2018-08-15 DIAGNOSIS — I10 Essential (primary) hypertension: Secondary | ICD-10-CM

## 2018-08-15 DIAGNOSIS — I2510 Atherosclerotic heart disease of native coronary artery without angina pectoris: Secondary | ICD-10-CM

## 2018-08-15 DIAGNOSIS — E785 Hyperlipidemia, unspecified: Secondary | ICD-10-CM

## 2018-08-15 DIAGNOSIS — I2583 Coronary atherosclerosis due to lipid rich plaque: Secondary | ICD-10-CM

## 2018-08-15 MED ORDER — CLOPIDOGREL BISULFATE 75 MG PO TABS: 75.0000 mg | ORAL_TABLET | Freq: Every day | ORAL | 3 refills | Status: DC

## 2018-08-15 MED ORDER — METOPROLOL TARTRATE 50 MG PO TABS: 50.0000 mg | ORAL_TABLET | Freq: Two times a day (BID) | ORAL | 3 refills | Status: DC

## 2018-08-15 MED ORDER — AMLODIPINE BESYLATE 10 MG PO TABS: 10.0000 mg | ORAL_TABLET | Freq: Every day | ORAL | 3 refills | Status: DC

## 2018-10-12 ENCOUNTER — Other Ambulatory Visit: Payer: Self-pay | Admitting: Physician Assistant

## 2018-10-12 MED ORDER — LOSARTAN POTASSIUM 50 MG PO TABS: 50.0000 mg | ORAL_TABLET | Freq: Every day | ORAL | 3 refills | Status: DC

## 2018-10-12 MED ORDER — HYDROCHLOROTHIAZIDE 12.5 MG PO CAPS: 12.5000 mg | ORAL_CAPSULE | Freq: Every day | ORAL | 3 refills | Status: DC

## 2018-10-12 MED ORDER — ATORVASTATIN CALCIUM 40 MG PO TABS: 40.0000 mg | ORAL_TABLET | Freq: Every day | ORAL | 3 refills | Status: DC

## 2019-05-31 ENCOUNTER — Encounter: Payer: Self-pay | Admitting: Family Medicine

## 2019-05-31 ENCOUNTER — Telehealth (INDEPENDENT_AMBULATORY_CARE_PROVIDER_SITE_OTHER): Payer: Self-pay | Admitting: Family Medicine

## 2019-05-31 ENCOUNTER — Other Ambulatory Visit: Payer: Self-pay

## 2019-05-31 DIAGNOSIS — F418 Other specified anxiety disorders: Secondary | ICD-10-CM

## 2019-05-31 MED ORDER — SERTRALINE HCL 50 MG PO TABS: 50.0000 mg | ORAL_TABLET | Freq: Every day | ORAL | 2 refills | Status: DC

## 2019-05-31 NOTE — Patient Instructions (Signed)
Health Maintenance Due  Topic Date Due  . Hepatitis C Screening  11-10-1960  . HIV Screening  02/05/1976  . TETANUS/TDAP  02/05/1980  . COLONOSCOPY  02/05/2011  . INFLUENZA VACCINE  11/05/2018    No flowsheet data found.

## 2019-05-31 NOTE — Progress Notes (Signed)
Virtual Visit via Video Note  I connected with the patient on 05/31/19 at  3:30 PM EST by a video enabled telemedicine application and verified that I am speaking with the correct person using two identifiers.  Location patient: home Location provider:work or home office Persons participating in the virtual visit: patient, provider  I discussed the limitations of evaluation and management by telemedicine and the availability of in person appointments. The patient expressed understanding and agreed to proceed.   HPI: Here for treatment of what he thinks is anxiety. Over the past 3 months he and his wife have noticed a gradual worsening of his stress levels, he worries about things, he has trouble sleeping, and he has become very short tempered. He has some mild sadness as well. His appetite is normal. He notes that we treat his son with Effexor for anxiety and that he does well.    ROS: See pertinent positives and negatives per HPI.  Past Medical History:  Diagnosis Date  . Alcohol use    Regular alcohol use, to cut down   . CAD (coronary artery disease)    Non-STEMI January 02, 2011, DES to OM, residual chronically occluded small RCA with collaterals 1/19 PCI/DES to pLcx, and OM (3rd stent), normal EF  . Dyslipidemia   . Ejection fraction    EF 40% at the time of acute MI /  EF 55%, January 19, 2011, outpatient echo, no focal wall motion abnormalities.  Marland Kitchen GERD (gastroesophageal reflux disease)   . Hiatal hernia    history  . Hyperlipidemia   . Hypertension   . MI (myocardial infarction) [facility])     Past Surgical History:  Procedure Laterality Date  . CARDIAC CATHETERIZATION  04/13/2017  . CORONARY ANGIOPLASTY WITH STENT PLACEMENT  01/02/2011; 04/13/2017   Archie Endo 01/03/2011;   . CORONARY STENT INTERVENTION N/A 04/13/2017   Procedure: CORONARY STENT INTERVENTION;  Surgeon: Martinique, Peter M, MD;  Location: [place] [facility];  Service: Cardiovascular;  Laterality: N/A;  . LEFT HEART  CATH AND CORONARY ANGIOGRAPHY N/A 04/13/2017   Procedure: LEFT HEART CATH AND CORONARY ANGIOGRAPHY;  Surgeon: Martinique, Peter M, MD;  Location: [place] [facility];  Service: Cardiovascular;  Laterality: N/A;  . SMALL INTESTINE SURGERY  1970   intestinal repair surgery after traumatic injury at the age of 40    Family History  Problem Relation Age of Onset  . Hypertension Mother   . Arthritis Mother   . Tremor Mother   . Alcohol abuse Father        had heart surgery, family is estranged from father  . Hypertension Brother      Current Outpatient Medications:  .  amLODipine (NORVASC) 10 MG tablet, Take 1 tablet (10 mg total) by mouth daily., Disp: 90 tablet, Rfl: 3 .  aspirin 81 MG chewable tablet, Chew 81 mg by mouth daily., Disp: , Rfl:  .  atorvastatin (LIPITOR) 40 MG tablet, Take 1 tablet (40 mg total) by mouth at bedtime., Disp: 90 tablet, Rfl: 3 .  clopidogrel (PLAVIX) 75 MG tablet, Take 1 tablet (75 mg total) by mouth daily., Disp: 90 tablet, Rfl: 3 .  hydrochlorothiazide (MICROZIDE) 12.5 MG capsule, Take 1 capsule (12.5 mg total) by mouth daily., Disp: 90 capsule, Rfl: 3 .  losartan (COZAAR) 50 MG tablet, Take 1 tablet (50 mg total) by mouth daily., Disp: 90 tablet, Rfl: 3 .  metoprolol tartrate (LOPRESSOR) 50 MG tablet, Take 1 tablet (50 mg total) by mouth 2 (two)  times daily., Disp: 180 tablet, Rfl: 3 .  nitroGLYCERIN (NITROSTAT) 0.4 MG SL tablet, Place 1 tablet (0.4 mg total) under the tongue every 5 (five) minutes as needed for chest pain., Disp: 25 tablet, Rfl: 3 .  sertraline (ZOLOFT) 50 MG tablet, Take 1 tablet (50 mg total) by mouth daily., Disp: 30 tablet, Rfl: 2  EXAM:  VITALS per patient if applicable:  GENERAL: alert, oriented, appears well and in no acute distress  HEENT: atraumatic, conjunttiva clear, no obvious abnormalities on inspection of external nose and ears  NECK: normal movements of the head and neck  LUNGS: on inspection no signs of respiratory distress,  breathing rate appears normal, no obvious gross SOB, gasping or wheezing  CV: no obvious cyanosis  MS: moves all visible extremities without noticeable abnormality  PSYCH/NEURO: pleasant and cooperative, no obvious depression or anxiety, speech and thought processing grossly intact  ASSESSMENT AND PLAN: Depression with anxiety. We will treat this with Zoloft 50 mg daily. Recheck in 3 weeks. I also advised him to get exercise daily.  Gershon Crane, MD  Discussed the following assessment and plan:  No diagnosis found.     I discussed the assessment and treatment plan with the patient. The patient was provided an opportunity to ask questions and all were answered. The patient agreed with the plan and demonstrated an understanding of the instructions.   The patient was advised to call back or seek an in-person evaluation if the symptoms worsen or if the condition fails to improve as anticipated.

## 2019-07-04 ENCOUNTER — Telehealth: Payer: Self-pay | Admitting: Family Medicine

## 2019-07-04 NOTE — Telephone Encounter (Signed)
Ernest Bishop, pt's spouse, stated that Dr. Clent Ridges started him on Zoloft and to call to let him know how it is working after a few weeks. She states it is working great for him and they need a refill.   Medication Refill: ZOLOFT  Pharmacy: [facility] FAX: [phone]

## 2019-07-05 MED ORDER — SERTRALINE HCL 50 MG PO TABS: 50.0000 mg | ORAL_TABLET | Freq: Every day | ORAL | 3 refills | Status: DC

## 2019-07-05 NOTE — Telephone Encounter (Signed)
Rx has been sent in. Left a detailed message on verified voice mail.   

## 2019-07-05 NOTE — Addendum Note (Signed)
Addended by: Solon Augusta on: 07/05/2019 09:01 AM   Modules accepted: Orders

## 2019-07-05 NOTE — Telephone Encounter (Signed)
Call in Zoloft 50 mg daily, #90 with 3 rf

## 2019-08-01 NOTE — Progress Notes (Signed)
Virtual Visit via Video Note   This visit type was conducted due to national recommendations for restrictions regarding the COVID-19 Pandemic (e.g. social distancing) in an effort to limit this patient's exposure and mitigate transmission in our community.  Due to his co-morbid illnesses, this patient is at least at moderate risk for complications without adequate follow up.  This format is felt to be most appropriate for this patient at this time.  All issues noted in this document were discussed and addressed.  A limited physical exam was performed with this format.  Please refer to the patient's chart for his consent to telehealth for [facility].   The patient was identified using 2 identifiers.  Date:  08/02/2019   ID:  Ernest Bishop, DOB [id number], MRN [id number]  Patient Location: Home Provider Location: Office  PCP:  Nelwyn Salisbury, MD  Cardiologist:  Tobias Alexander, MD   Electrophysiologist:  None   Evaluation Performed:  Follow-Up Visit  Chief Complaint: CAD  Patient Profile: Ernest Bishop is a 59 y.o. male with:  Coronary artery disease   S/p NSTEMI in 12/2010 tx with DES to OM1  Known CTO of RCA  S/p DES to OM1 and LCx in 09/2012 [facility] [place])  S/p DES to OM1 and mLCx in 04/2017; residual oD1 90 (med Rx unless refractory angina)  Hypertension  Hyperlipidemia   Prior CV Studies: Cardiac Catheterization April 17, 2017 D1 ost 90 LCx ost 50, prox 90; OM1 ost 95 ISR RCA prox 100 - CTO w/ R-L collaterals EF 55-65 PCI:  3.5 x 16 mm Synergy DES to prox LCx; 2.5 x 20 mm Synergy DES to OM1  Echocardiogram 01/19/11 Mild LVH, EF 50-55, normal wall motion, normal diastolic function, mild LAE, trivial pericardial effusion  Cardiac Catheterization 01/02/11 LAD okay LCx mild plaque, OM1 99 (culprit) RCA mid 100-CTO R-R collaterals treated medically EF 40 PCI: 2.5 x 60 mm Promus element DES to the OM1   History of Present Illness:   [name] was last  seen by me via Telemedicine in 06/2018.  He returns for cardiology follow up.  Since last seen, he has done well.  He has not had chest discomfort, shortness of breath, syncope, orthopnea, lower extremity swelling.  He notes that since his last PCI, he has felt the best he has felt in a very long time.  Past Medical History:  Diagnosis Date  . Alcohol use    Regular alcohol use, to cut down   . CAD (coronary artery disease)    Non-STEMI January 02, 2011, DES to OM, residual chronically occluded small RCA with collaterals 1/19 PCI/DES to pLcx, and OM (3rd stent), normal EF  . Dyslipidemia   . Ejection fraction    EF 40% at the time of acute MI /  EF 55%, January 19, 2011, outpatient echo, no focal wall motion abnormalities.  Marland Kitchen GERD (gastroesophageal reflux disease)   . Hiatal hernia    history  . Hyperlipidemia   . Hypertension   . MI (myocardial infarction) [facility])    Past Surgical History:  Procedure Laterality Date  . CARDIAC CATHETERIZATION  04/17/17  . CORONARY ANGIOPLASTY WITH STENT PLACEMENT  01/02/2011; 17-Apr-2017   Ernest Bishop 01/03/2011;   . CORONARY STENT INTERVENTION N/A 04/17/17   Procedure: CORONARY STENT INTERVENTION;  Surgeon: Swaziland, Peter M, MD;  Location: [facility] INVASIVE [facility];  Service: Cardiovascular;  Laterality: N/A;  . LEFT HEART CATH AND CORONARY ANGIOGRAPHY N/A 04-17-2017   Procedure: LEFT HEART CATH AND  CORONARY ANGIOGRAPHY;  Surgeon: Martinique, Peter M, MD;  Location: [place] [facility];  Service: Cardiovascular;  Laterality: N/A;  . SMALL INTESTINE SURGERY  1970   intestinal repair surgery after traumatic injury at the age of 38     Current Meds  Medication Sig  . amLODipine (NORVASC) 10 MG tablet Take 1 tablet (10 mg total) by mouth daily.  Marland Kitchen aspirin 81 MG chewable tablet Chew 81 mg by mouth daily.  Marland Kitchen atorvastatin (LIPITOR) 40 MG tablet Take 1 tablet (40 mg total) by mouth at bedtime.  . clopidogrel (PLAVIX) 75 MG tablet Take 1 tablet (75 mg total) by mouth daily.  .  hydrochlorothiazide (MICROZIDE) 12.5 MG capsule Take 1 capsule (12.5 mg total) by mouth daily.  Marland Kitchen losartan (COZAAR) 50 MG tablet Take 1 tablet (50 mg total) by mouth daily.  . metoprolol tartrate (LOPRESSOR) 50 MG tablet Take 1 tablet (50 mg total) by mouth 2 (two) times daily.  . nitroGLYCERIN (NITROSTAT) 0.4 MG SL tablet Place 1 tablet (0.4 mg total) under the tongue every 5 (five) minutes as needed for chest pain.  Marland Kitchen sertraline (ZOLOFT) 50 MG tablet Take 1 tablet (50 mg total) by mouth daily.     Allergies:   Codeine and Hydrocodone   Social History   Tobacco Use  . Smoking status: Former Research scientist (life sciences)  . Smokeless tobacco: Never Used  Substance Use Topics  . Alcohol use: Yes  . Drug use: No     Family Hx: The patient's family history includes Alcohol abuse in his father; Arthritis in his mother; Hypertension in his brother and mother; Tremor in his mother.  ROS:   Please see the history of present illness.   He has not had any melena, hematochezia, hematuria.  Labs/Other Tests and Data Reviewed:    EKG:  No ECG reviewed.  Recent Labs: No results found for requested labs within last 8760 hours.   Recent Lipid Panel Lab Results  Component Value Date/Time   CHOL 197 02/12/2016 01:56 PM   TRIG 96 02/12/2016 01:56 PM   HDL 44 02/12/2016 01:56 PM   CHOLHDL 4.5 02/12/2016 01:56 PM   LDLCALC 134 (H) 02/12/2016 01:56 PM    Wt Readings from Last 3 Encounters:  08/02/19 223 lb (101.2 kg)  07/05/18 234 lb (106.1 kg)  04/14/17 230 lb 13.2 oz (104.7 kg)     Objective:    Vital Signs:  BP 127/69   Pulse 68   Ht 5\' 10"  (1.778 m)   Wt 223 lb (101.2 kg)   BMI 32.00 kg/m    VITAL SIGNS:  reviewed GEN:  no acute distress EYES:  sclerae anicteric, EOMI - Extraocular Movements Intact RESPIRATORY:  Normal respiratory effort NEURO:  alert and oriented x 3, no obvious focal deficit PSYCH:  normal affect  ASSESSMENT & PLAN:    1. Coronary artery disease involving native coronary  artery of native heart with angina pectoris ([place]) History of non-ST elevation myocardial infarction in 2012 treated with DES to the OM1.  He has had multiple percutaneous coronary interventions since that time.  He underwent DES to the OM1 and distal LCx in 2014 and DES to the proximal LCx and ostial OM1 in 2019.  He has high-grade stenosis in a diagonal vessel that has been managed medically unless he has refractory angina.  He has been doing well without anginal symptoms on his current medical regimen.  Given his multiple stents, he should remain on dual antiplatelet therapy unless he has  issues with bleeding.  Continue aspirin, clopidogrel, metoprolol, losartan, atorvastatin.  Arrange follow-up in 6 months in person with an EKG.  2. Essential hypertension The patient's blood pressure is controlled on his current regimen.  Continue current therapy.   3. Dyslipidemia Continue high intensity statin therapy.  Arrange fasting CMET, lipids prior to next visit.    Coronary artery disease involving native coronary artery of native heart with angina pectoris (HCC) History of non-STEMI in 9/12 treated with DES to the OM1. Subsequently, he had a DES placed to the OM1 and distal circumflex in 09/2012.  His last cardiac catheterization was in January 2019 and he was noted to have high-grade disease in the proximal LCx and ostial OM1.  Both lesions were treated with drug-eluting stents.  He has residual disease in an ostial first diagonal that could be treated with PCI if he has refractory angina.  He has a known chronically occluded RCA.  His LV function was normal.  He has not had any recurrent anginal symptoms.  He does note decreased energy and also has disrupted sleep.  I have asked him to try to work on his sleep hygiene and to maintain regular exercise for at least 30 minutes every day.  Continue amlodipine, aspirin, atorvastatin, clopidogrel, metoprolol.  Follow-up 1 year.  Essential hypertension Blood  pressure above target.  Increase amlodipine to 10 mg daily.  He will notify me if his blood pressure remains elevated.  Dyslipidemia Continue high intensity statin.  Dizziness He likely has some form of vertigo which has contributed to a vagal reaction.  This seems to only occur when he makes sudden turns on his 0 turn mower.  I have asked him to contact me if he has recurrent symptoms.  In the event that these continue or worsen, I would like him to wear an event monitor to rule out arrhythmia for his symptoms.   Time:   Today, I have spent 6 minutes with the patient with telehealth technology discussing the above problems.     Medication Adjustments/Labs and Tests Ordered: Current medicines are reviewed at length with the patient today.  Concerns regarding medicines are outlined above.   Tests Ordered: Orders Placed This Encounter  Procedures  . Comprehensive metabolic panel  . Lipid panel    Medication Changes: No orders of the defined types were placed in this encounter.   Follow Up:  In Person in 6 month(s)  Signed, Tereso Newcomer, PA-C  08/02/2019 9:17 AM    [facility]

## 2019-08-02 ENCOUNTER — Other Ambulatory Visit: Payer: Self-pay

## 2019-08-02 ENCOUNTER — Encounter: Payer: Self-pay | Admitting: Physician Assistant

## 2019-08-02 ENCOUNTER — Telehealth (INDEPENDENT_AMBULATORY_CARE_PROVIDER_SITE_OTHER): Payer: Self-pay | Admitting: Physician Assistant

## 2019-08-02 VITALS — BP 127/69 | HR 68 | Ht 70.0 in | Wt 223.0 lb

## 2019-08-02 DIAGNOSIS — I10 Essential (primary) hypertension: Secondary | ICD-10-CM

## 2019-08-02 DIAGNOSIS — Z79899 Other long term (current) drug therapy: Secondary | ICD-10-CM

## 2019-08-02 DIAGNOSIS — E785 Hyperlipidemia, unspecified: Secondary | ICD-10-CM

## 2019-08-02 DIAGNOSIS — Z87891 Personal history of nicotine dependence: Secondary | ICD-10-CM

## 2019-08-02 DIAGNOSIS — I25119 Atherosclerotic heart disease of native coronary artery with unspecified angina pectoris: Secondary | ICD-10-CM

## 2019-08-02 NOTE — Patient Instructions (Signed)
Medication Instructions:  Continue current medications as listed   *If you need a refill on your cardiac medications before your next appointment, please call your pharmacy*   Lab Work: On the day of your next visit in 6 mos:  CMET  Fasting Lipids  If you have labs (blood work) drawn today and your tests are completely normal, you will receive your results only by: Marland Kitchen MyChart Message (if you have MyChart) OR . A paper copy in the mail If you have any lab test that is abnormal or we need to change your treatment, we will call you to review the results.   Follow-Up: At [facility], you and your health needs are our priority.  As part of our continuing mission to provide you with exceptional heart care, we have created designated Provider Care Teams.  These Care Teams include your primary Cardiologist (physician) and Advanced Practice Providers (APPs -  Physician Assistants and Nurse Practitioners) who all work together to provide you with the care you need, when you need it.  We recommend signing up for the patient portal called "MyChart".  Sign up information is provided on this After Visit Summary.  MyChart is used to connect with patients for Virtual Visits (Telemedicine).  Patients are able to view lab/test results, encounter notes, upcoming appointments, etc.  Non-urgent messages can be sent to your provider as well.   To learn more about what you can do with MyChart, go to [email or web address].    Your next appointment:   6 month(s)  The format for your next appointment:   In Person  Provider:   Tereso Newcomer, PA-C

## 2019-11-22 ENCOUNTER — Other Ambulatory Visit: Payer: Self-pay | Admitting: Physician Assistant

## 2019-11-22 ENCOUNTER — Other Ambulatory Visit: Payer: Self-pay | Admitting: Cardiology

## 2019-12-28 ENCOUNTER — Other Ambulatory Visit: Payer: Self-pay | Admitting: Cardiology

## 2019-12-28 DIAGNOSIS — I2510 Atherosclerotic heart disease of native coronary artery without angina pectoris: Secondary | ICD-10-CM

## 2019-12-28 DIAGNOSIS — I10 Essential (primary) hypertension: Secondary | ICD-10-CM

## 2019-12-28 DIAGNOSIS — E785 Hyperlipidemia, unspecified: Secondary | ICD-10-CM

## 2020-02-22 ENCOUNTER — Other Ambulatory Visit: Payer: Self-pay | Admitting: Physician Assistant

## 2020-05-25 ENCOUNTER — Other Ambulatory Visit: Payer: Self-pay | Admitting: Cardiology

## 2020-06-18 ENCOUNTER — Other Ambulatory Visit: Payer: Self-pay | Admitting: Cardiology

## 2020-06-18 DIAGNOSIS — E785 Hyperlipidemia, unspecified: Secondary | ICD-10-CM

## 2020-06-18 DIAGNOSIS — I2510 Atherosclerotic heart disease of native coronary artery without angina pectoris: Secondary | ICD-10-CM

## 2020-06-18 DIAGNOSIS — I10 Essential (primary) hypertension: Secondary | ICD-10-CM

## 2020-07-15 ENCOUNTER — Other Ambulatory Visit: Payer: Self-pay | Admitting: Family Medicine

## 2020-07-15 ENCOUNTER — Other Ambulatory Visit: Payer: Self-pay | Admitting: Physician Assistant

## 2020-07-15 DIAGNOSIS — I2583 Coronary atherosclerosis due to lipid rich plaque: Secondary | ICD-10-CM

## 2020-07-15 DIAGNOSIS — E785 Hyperlipidemia, unspecified: Secondary | ICD-10-CM

## 2020-07-15 DIAGNOSIS — I2510 Atherosclerotic heart disease of native coronary artery without angina pectoris: Secondary | ICD-10-CM

## 2020-07-15 DIAGNOSIS — I10 Essential (primary) hypertension: Secondary | ICD-10-CM

## 2020-08-22 ENCOUNTER — Emergency Department (HOSPITAL_BASED_OUTPATIENT_CLINIC_OR_DEPARTMENT_OTHER): Payer: Self-pay

## 2020-08-22 ENCOUNTER — Emergency Department (HOSPITAL_BASED_OUTPATIENT_CLINIC_OR_DEPARTMENT_OTHER)
Admission: EM | Admit: 2020-08-22 | Discharge: 2020-08-22 | Disposition: A | Discharge status: Home / Self Care | Payer: Self-pay | Attending: Emergency Medicine | Admitting: Emergency Medicine

## 2020-08-22 ENCOUNTER — Encounter (HOSPITAL_BASED_OUTPATIENT_CLINIC_OR_DEPARTMENT_OTHER): Payer: Self-pay | Admitting: *Deleted

## 2020-08-22 ENCOUNTER — Emergency Department (HOSPITAL_COMMUNITY): Admission: EM | Admit: 2020-08-22 | Discharge: 2020-08-22 | Discharge status: Against Medical Advice | Payer: Self-pay

## 2020-08-22 ENCOUNTER — Other Ambulatory Visit: Payer: Self-pay

## 2020-08-22 DIAGNOSIS — S51812A Laceration without foreign body of left forearm, initial encounter: Secondary | ICD-10-CM | POA: Insufficient documentation

## 2020-08-22 DIAGNOSIS — W540XXA Bitten by dog, initial encounter: Secondary | ICD-10-CM | POA: Insufficient documentation

## 2020-08-22 DIAGNOSIS — Z79899 Other long term (current) drug therapy: Secondary | ICD-10-CM | POA: Insufficient documentation

## 2020-08-22 DIAGNOSIS — Z7982 Long term (current) use of aspirin: Secondary | ICD-10-CM | POA: Insufficient documentation

## 2020-08-22 DIAGNOSIS — Z23 Encounter for immunization: Secondary | ICD-10-CM | POA: Insufficient documentation

## 2020-08-22 DIAGNOSIS — I10 Essential (primary) hypertension: Secondary | ICD-10-CM | POA: Insufficient documentation

## 2020-08-22 DIAGNOSIS — I2510 Atherosclerotic heart disease of native coronary artery without angina pectoris: Secondary | ICD-10-CM | POA: Insufficient documentation

## 2020-08-22 DIAGNOSIS — Z955 Presence of coronary angioplasty implant and graft: Secondary | ICD-10-CM | POA: Insufficient documentation

## 2020-08-22 DIAGNOSIS — Z87891 Personal history of nicotine dependence: Secondary | ICD-10-CM | POA: Insufficient documentation

## 2020-08-22 LAB — CBC WITH DIFFERENTIAL/PLATELET
Abs Immature Granulocytes: 0.07 10*3/uL (ref 0.00–0.07)
Basophils Absolute: 0.1 10*3/uL (ref 0.0–0.1)
Basophils Relative: 1 %
Eosinophils Absolute: 0.1 10*3/uL (ref 0.0–0.5)
Eosinophils Relative: 1 %
HCT: 46 % (ref 39.0–52.0)
Hemoglobin: 15.7 g/dL (ref 13.0–17.0)
Immature Granulocytes: 0 %
Lymphocytes Relative: 11 %
Lymphs Abs: 1.8 10*3/uL (ref 0.7–4.0)
MCH: 32.3 pg (ref 26.0–34.0)
MCHC: 34.1 g/dL (ref 30.0–36.0)
MCV: 94.7 fL (ref 80.0–100.0)
Monocytes Absolute: 1.3 10*3/uL — ABNORMAL HIGH (ref 0.1–1.0)
Monocytes Relative: 8 %
Neutro Abs: 13.4 10*3/uL — ABNORMAL HIGH (ref 1.7–7.7)
Neutrophils Relative %: 79 %
Platelets: 244 10*3/uL (ref 150–400)
RBC: 4.86 MIL/uL (ref 4.22–5.81)
RDW: 12 % (ref 11.5–15.5)
WBC: 16.7 10*3/uL — ABNORMAL HIGH (ref 4.0–10.5)
nRBC: 0 % (ref 0.0–0.2)

## 2020-08-22 LAB — BASIC METABOLIC PANEL
Anion gap: 11 (ref 5–15)
BUN: 11 mg/dL (ref 6–20)
CO2: 22 mmol/L (ref 22–32)
Calcium: 9.2 mg/dL (ref 8.9–10.3)
Chloride: 104 mmol/L (ref 98–111)
Creatinine, Ser: 0.98 mg/dL (ref 0.61–1.24)
GFR, Estimated: >60 mL/min (ref >60)
Glucose, Bld: 97 mg/dL (ref 70–99)
Potassium: 3.8 mmol/L (ref 3.5–5.1)
Sodium: 137 mmol/L (ref 135–145)

## 2020-08-22 MED ORDER — TETANUS-DIPHTH-ACELL PERTUSSIS 5-2.5-18.5 LF-MCG/0.5 IM SUSY
0.5000 mL | PREFILLED_SYRINGE | Freq: Once | INTRAMUSCULAR | Status: AC
  Administered 2020-08-22: 0.5 mL via INTRAMUSCULAR
  Filled 2020-08-22: qty 0.5

## 2020-08-22 MED ORDER — AMOXICILLIN-POT CLAVULANATE 875-125 MG PO TABS: 1.0000 | ORAL_TABLET | Freq: Two times a day (BID) | ORAL | 0 refills | Status: DC

## 2020-08-22 MED ORDER — LIDOCAINE-EPINEPHRINE (PF) 2 %-1:200000 IJ SOLN
20.0000 mL | Freq: Once | INTRAMUSCULAR | Status: AC
  Administered 2020-08-22: 20 mL
  Filled 2020-08-22: qty 20

## 2020-08-22 MED ORDER — OXYCODONE-ACETAMINOPHEN 5-325 MG PO TABS
1.0000 | ORAL_TABLET | Freq: Once | ORAL | Status: AC
  Administered 2020-08-22: 1 via ORAL
  Filled 2020-08-22: qty 1

## 2020-08-22 MED ORDER — OXYCODONE-ACETAMINOPHEN 5-325 MG PO TABS: 1.0000 | ORAL_TABLET | Freq: Four times a day (QID) | ORAL | 0 refills | Status: DC | PRN

## 2020-08-22 MED ORDER — DIPHENHYDRAMINE HCL 25 MG PO CAPS
25.0000 mg | ORAL_CAPSULE | Freq: Once | ORAL | Status: AC
  Administered 2020-08-22: 25 mg via ORAL
  Filled 2020-08-22: qty 1

## 2020-08-22 NOTE — Discharge Instructions (Addendum)
Seen in the emergency department for evaluation of injuries from a dog bite.  Your x-ray showed a possible fracture of the distal radius.  You had 2 wounds that were irrigated and closed with sutures.  Please contact Dr. Roney Mans for close follow-up.  Finish your antibiotics.

## 2020-08-22 NOTE — ED Notes (Signed)
ED Provider at bedside. 

## 2020-08-22 NOTE — ED Provider Notes (Signed)
[facility] EMERGENCY DEPARTMENT Provider Note   CSN: [id number] Arrival date & time: 08/22/20  1502     History Chief Complaint  Patient presents with  . Animal Bite    Ernest Bishop is a 60 y.o. male.  Patient here with complaint of left forearm injury from a dog bite.  Dog is up-to-date on immunizations.  Patient does not know the last time he had a tetanus shot.  Complaining of severe pain left forearm.  Increased with any movement.  Denies any numbness or weakness.  The history is provided by the patient.  Animal Bite Contact animal:  Dog Location:  Shoulder/arm Shoulder/arm injury location:  L forearm Pain details:    Quality:  Aching   Severity:  Severe   Timing:  Constant   Progression:  Unchanged Notifications:  None Animal's rabies vaccination status:  Up to date Animal in possession: yes   Tetanus status:  Out of date Relieved by:  None tried Worsened by:  Activity Ineffective treatments:  None tried Associated symptoms: swelling   Associated symptoms: no fever, no numbness and no rash        Past Medical History:  Diagnosis Date  . Alcohol use    Regular alcohol use, to cut down   . CAD (coronary artery disease)    Non-STEMI January 02, 2011, DES to OM, residual chronically occluded small RCA with collaterals 1/19 PCI/DES to pLcx, and OM (3rd stent), normal EF  . Dyslipidemia   . Ejection fraction    EF 40% at the time of acute MI /  EF 55%, January 19, 2011, outpatient echo, no focal wall motion abnormalities.  Marland Kitchen GERD (gastroesophageal reflux disease)   . Hiatal hernia    history  . Hyperlipidemia   . Hypertension   . MI (myocardial infarction) [facility]     Patient Active Problem List   Diagnosis Date Noted  . Ejection fraction   . CAD (coronary artery disease)   . Hypertension   . Dyslipidemia   . Alcohol use     Past Surgical History:  Procedure Laterality Date  . CARDIAC CATHETERIZATION  04/13/2017  . CORONARY  ANGIOPLASTY WITH STENT PLACEMENT  01/02/2011; 04/13/2017   Hattie Perch 01/03/2011;   . CORONARY STENT INTERVENTION N/A 04/13/2017   Procedure: CORONARY STENT INTERVENTION;  Surgeon: Swaziland, Peter M, MD;  Location: [facility] INVASIVE CV LAB;  Service: Cardiovascular;  Laterality: N/A;  . LEFT HEART CATH AND CORONARY ANGIOGRAPHY N/A 04/13/2017   Procedure: LEFT HEART CATH AND CORONARY ANGIOGRAPHY;  Surgeon: Swaziland, Peter M, MD;  Location: [facility] INVASIVE CV LAB;  Service: Cardiovascular;  Laterality: N/A;  . SMALL INTESTINE SURGERY  1970   intestinal repair surgery after traumatic injury at the age of 4       Family History  Problem Relation Age of Onset  . Hypertension Mother   . Arthritis Mother   . Tremor Mother   . Alcohol abuse Father        had heart surgery, family is estranged from father  . Hypertension Brother     Social History   Tobacco Use  . Smoking status: Former Games developer  . Smokeless tobacco: Never Used  Vaping Use  . Vaping Use: Never used  Substance Use Topics  . Alcohol use: Yes  . Drug use: No    Home Medications Prior to Admission medications   Medication Sig Start Date End Date Taking? Authorizing Provider  amLODipine (NORVASC) 10 MG tablet TAKE ONE TABLET BY  MOUTH EVERY DAY 11/22/19   Lars Masson, MD  aspirin 81 MG chewable tablet Chew 81 mg by mouth daily.    [provider]  atorvastatin (LIPITOR) 40 MG tablet TAKE ONE TABLET BY MOUTH AT BEDTIME 11/22/19   Lars Masson, MD  clopidogrel (PLAVIX) 75 MG tablet Take 1 tablet (75 mg total) by mouth daily. Please make overdue appt with Cardiologist before anymore refills. Thank you 1st attempt 07/15/20   Tereso Newcomer T, PA-C  hydrochlorothiazide (MICROZIDE) 12.5 MG capsule TAKE 1 CAPSULE BY MOUTH EVERY DAY 02/22/20   Lars Masson, MD  losartan (COZAAR) 50 MG tablet TAKE ONE TABLET BY MOUTH EVERY DAY 02/22/20   Lars Masson, MD  metoprolol tartrate (LOPRESSOR) 50 MG tablet TAKE ONE TABLET BY MOUTH  TWICE DAILY 05/28/20   Lars Masson, MD  nitroGLYCERIN (NITROSTAT) 0.4 MG SL tablet Place 1 tablet (0.4 mg total) under the tongue every 5 (five) minutes as needed for chest pain. 02/02/17   Tereso Newcomer T, PA-C  sertraline (ZOLOFT) 50 MG tablet Take 1 tablet (50 mg total) by mouth daily. 07/05/19   Nelwyn Salisbury, MD    Allergies    Codeine and Hydrocodone  Review of Systems   Review of Systems  Constitutional: Negative for fever.  HENT: Negative for sore throat.   Respiratory: Negative for shortness of breath.   Cardiovascular: Negative for chest pain.  Gastrointestinal: Negative for abdominal pain.  Skin: Positive for wound. Negative for rash.  Neurological: Negative for numbness.    Physical Exam Updated Vital Signs BP (!) 140/102 (BP Location: Right Arm)   Pulse 89   Temp 98.8 F (37.1 C) (Oral)   Resp 16   Ht 5\' 10"  (1.778 m)   Wt 99.3 kg   SpO2 99%   BMI 31.42 kg/m   Physical Exam Vitals and nursing note reviewed.  Constitutional:      Appearance: Normal appearance. He is well-developed.  HENT:     Head: Normocephalic and atraumatic.  Eyes:     Conjunctiva/sclera: Conjunctivae normal.  Pulmonary:     Effort: Pulmonary effort is normal.  Musculoskeletal:        General: Swelling, tenderness and signs of injury present. Normal range of motion.     Cervical back: Neck supple.     Comments: Patient with 2 moderate lacerations approximately 3 cm each on forearm.  There are some smaller puncture wounds also.  Compartments are soft but tender.  Distal radial ulnar and median appear intact.  Cap refill and radial pulse 2+.  Skin:    General: Skin is warm and dry.  Neurological:     General: No focal deficit present.     Mental Status: He is alert.     GCS: GCS eye subscore is 4. GCS verbal subscore is 5. GCS motor subscore is 6.     ED Results / Procedures / Treatments   Labs (all labs ordered are listed, but only abnormal results are displayed) Labs  Reviewed  CBC WITH DIFFERENTIAL/PLATELET - Abnormal; Notable for the following components:      Result Value   WBC 16.7 (*)    Neutro Abs 13.4 (*)    Monocytes Absolute 1.3 (*)    All other components within normal limits  BASIC METABOLIC PANEL    EKG None  Radiology DG Forearm Left  Result Date: 08/22/2020 CLINICAL DATA:  Animal bite EXAM: LEFT FOREARM - 2 VIEW COMPARISON:  None. FINDINGS: No malalignment.  Questionable volar cortex discontinuity at the distal radius. Gas within the soft tissues of the distal forearm. No radiopaque foreign body IMPRESSION: 1. Questionable cortex discontinuity volar aspect of distal radius, recommend dedicated views of the wrist. 2. Gas in the soft tissues consistent with history of animal bite Electronically Signed   By: Jasmine PangKim  Fujinaga M.D.   On: 08/22/2020 17:35   DG Wrist Complete Left  Result Date: 08/22/2020 CLINICAL DATA:  Dog bite. EXAM: LEFT WRIST - COMPLETE 3+ VIEW COMPARISON:  None. FINDINGS: Three views study shows no gross fracture or dislocation although there is a cortical bony injury along the lateral aspect of the distal radial metaphysis. This region is deep to the skin laceration and gas in the soft tissues is compatible with the laceration. No evidence for retained radiopaque soft tissue foreign body. IMPRESSION: Lateral soft tissue laceration at the wrist with underlying cortical injury involving the lateral aspect of the distal radial metaphysis. Electronically Signed   By: Kennith CenterEric  Mansell M.D.   On: 08/22/2020 18:07    Procedures .Marland Kitchen.Laceration Repair  Date/Time: 08/22/2020 6:41 PM Performed by: Terrilee FilesButler, Alixandria Friedt C, MD Authorized by: Terrilee FilesButler, Bentlie Catanzaro C, MD   Consent:    Consent obtained:  Verbal   Consent given by:  Patient   Risks discussed:  Infection, pain, poor cosmetic result, poor wound healing, retained foreign body, need for additional repair, nerve damage, tendon damage and vascular damage   Alternatives discussed:  No treatment  and delayed treatment Universal protocol:    Procedure explained and questions answered to patient or proxy's satisfaction: yes     Patient identity confirmed:  Verbally with patient Anesthesia:    Anesthesia method:  Local infiltration   Local anesthetic:  Lidocaine 2% WITH epi Laceration details:    Location:  Shoulder/arm   Shoulder/arm location:  L lower arm   Length (cm):  4 Pre-procedure details:    Preparation:  Patient was prepped and draped in usual sterile fashion Exploration:    Imaging outcome: foreign body not noted     Wound exploration: wound explored through full range of motion   Treatment:    Area cleansed with:  Saline and povidone-iodine   Amount of cleaning:  Extensive   Irrigation solution:  Sterile saline   Irrigation method:  Pressure wash   Debridement:  None Skin repair:    Repair method:  Sutures   Suture size:  3-0   Suture material:  Nylon   Suture technique:  Simple interrupted   Number of sutures:  4 Approximation:    Approximation:  Loose Repair type:    Repair type:  Simple Post-procedure details:    Dressing:  Non-adherent dressing, bulky dressing and splint for protection   Procedure completion:  Tolerated well, no immediate complications     Medications Ordered in ED Medications  lidocaine-EPINEPHrine (XYLOCAINE W/EPI) 2 %-1:200000 (PF) injection 20 mL (has no administration in time range)  Tdap (BOOSTRIX) injection 0.5 mL (has no administration in time range)  oxyCODONE-acetaminophen (PERCOCET/ROXICET) 5-325 MG per tablet 1 tablet (1 tablet Oral Given 08/22/20 1620)  diphenhydrAMINE (BENADRYL) capsule 25 mg (25 mg Oral Given 08/22/20 1620)    ED Course  I have reviewed the triage vital signs and the nursing notes.  Pertinent labs & imaging results that were available during my care of the patient were reviewed by me and considered in my medical decision making (see chart for details).  Clinical Course as of 08/22/20 1840  Thu Aug 22, 2020  1827 Discussed with Dr. Roney Mans hand surgery who recommends closure and splint and follow-up in the office early next week.  Antibiotics. [MB]    Clinical Course User Index [MB] Terrilee Files, MD   MDM Rules/Calculators/A&P                         This patient complains of forearm pain left after dog bite; this involves an extensive number of treatment Options and is a complaint that carries with it a high risk of complications and Morbidity. The differential includes crush injury, fracture, compartment syndrome, tendon injury, vascular injury, infection  I ordered, reviewed and interpreted labs, which included CBC with elevated white count, normal hemoglobin, normal chemistries I ordered medication oral pain medication and tetanus update I ordered imaging studies which included left forearm and wrist and I independently    visualized and interpreted imaging which showed distal radius cortical injury Previous records obtained and reviewed in epic, no recent admissions I consulted hand surgery Dr. Roney Mans and discussed lab and imaging findings  Critical Interventions: None  After the interventions stated above, I reevaluated the patient and found patient still to be neuro intact.  He will be splinted by ED tech.  Recommended close follow-up with hand surgery and will place on antibiotics and pain medications.  Counseled to return to the emergency department if any signs of infection or worsening of his pain or any motor or sensory deficits.   Final Clinical Impression(s) / ED Diagnoses Final diagnoses:  Dog bite, initial encounter  Laceration of left forearm, initial encounter    Rx / DC Orders ED Discharge Orders         Ordered    amoxicillin-clavulanate (AUGMENTIN) 875-125 MG tablet  Every 12 hours        08/22/20 1846    oxyCODONE-acetaminophen (PERCOCET/ROXICET) 5-325 MG tablet  Every 6 hours PRN        08/22/20 1846           Terrilee Files,  MD 08/23/20 0830

## 2020-08-22 NOTE — ED Triage Notes (Signed)
C/o animal bite to left arm x 3 hrs ago

## 2020-08-22 NOTE — ED Notes (Signed)
V/o from PA for pain meds

## 2020-09-06 ENCOUNTER — Other Ambulatory Visit: Payer: Self-pay | Admitting: Family Medicine

## 2020-09-21 ENCOUNTER — Other Ambulatory Visit: Payer: Self-pay | Admitting: Family Medicine

## 2020-11-02 ENCOUNTER — Other Ambulatory Visit: Payer: Self-pay | Admitting: Physician Assistant

## 2020-11-02 DIAGNOSIS — E785 Hyperlipidemia, unspecified: Secondary | ICD-10-CM

## 2020-11-02 DIAGNOSIS — I10 Essential (primary) hypertension: Secondary | ICD-10-CM

## 2020-11-02 DIAGNOSIS — I2510 Atherosclerotic heart disease of native coronary artery without angina pectoris: Secondary | ICD-10-CM

## 2020-11-28 ENCOUNTER — Other Ambulatory Visit: Payer: Self-pay | Admitting: *Deleted

## 2020-11-28 MED ORDER — AMLODIPINE BESYLATE 10 MG PO TABS: 10.0000 mg | ORAL_TABLET | Freq: Every day | ORAL | 0 refills | Status: DC

## 2021-01-28 ENCOUNTER — Ambulatory Visit (INDEPENDENT_AMBULATORY_CARE_PROVIDER_SITE_OTHER): Payer: Self-pay | Admitting: Physician Assistant

## 2021-01-28 ENCOUNTER — Encounter: Payer: Self-pay | Admitting: Physician Assistant

## 2021-01-28 ENCOUNTER — Other Ambulatory Visit: Payer: Self-pay

## 2021-01-28 ENCOUNTER — Other Ambulatory Visit: Payer: Self-pay | Admitting: *Deleted

## 2021-01-28 VITALS — BP 124/78 | HR 58 | Ht 70.0 in | Wt 228.0 lb

## 2021-01-28 DIAGNOSIS — I2510 Atherosclerotic heart disease of native coronary artery without angina pectoris: Secondary | ICD-10-CM

## 2021-01-28 DIAGNOSIS — I2583 Coronary atherosclerosis due to lipid rich plaque: Secondary | ICD-10-CM

## 2021-01-28 DIAGNOSIS — I10 Essential (primary) hypertension: Secondary | ICD-10-CM

## 2021-01-28 DIAGNOSIS — E785 Hyperlipidemia, unspecified: Secondary | ICD-10-CM

## 2021-01-28 DIAGNOSIS — I25119 Atherosclerotic heart disease of native coronary artery with unspecified angina pectoris: Secondary | ICD-10-CM

## 2021-01-28 MED ORDER — ATORVASTATIN CALCIUM 40 MG PO TABS: 40.0000 mg | ORAL_TABLET | Freq: Every day | ORAL | 3 refills | Status: DC

## 2021-01-28 MED ORDER — NITROGLYCERIN 0.4 MG SL SUBL: 0.4000 mg | SUBLINGUAL_TABLET | SUBLINGUAL | 3 refills | Status: DC | PRN

## 2021-01-28 MED ORDER — CLOPIDOGREL BISULFATE 75 MG PO TABS: 75.0000 mg | ORAL_TABLET | Freq: Every day | ORAL | 3 refills | Status: DC

## 2021-01-28 MED ORDER — LOSARTAN POTASSIUM 50 MG PO TABS: 50.0000 mg | ORAL_TABLET | Freq: Every day | ORAL | 3 refills | Status: DC

## 2021-01-28 MED ORDER — METOPROLOL TARTRATE 50 MG PO TABS: 50.0000 mg | ORAL_TABLET | Freq: Two times a day (BID) | ORAL | 3 refills | Status: DC

## 2021-01-28 MED ORDER — AMLODIPINE BESYLATE 10 MG PO TABS: 10.0000 mg | ORAL_TABLET | Freq: Every day | ORAL | 3 refills | Status: DC

## 2021-01-28 MED ORDER — HYDROCHLOROTHIAZIDE 12.5 MG PO CAPS: ORAL_CAPSULE | ORAL | 3 refills | Status: DC

## 2021-01-28 NOTE — Assessment & Plan Note (Signed)
Continue atorvastatin 40 mg daily.  Arrange fasting lipids and LFTs.

## 2021-01-28 NOTE — Assessment & Plan Note (Signed)
Blood pressure is well controlled.  Continue amlodipine 10 mg daily, HCTZ 12.5 mg daily, losartan 50 mg daily, metoprolol tartrate 50 mg twice daily.

## 2021-01-28 NOTE — Progress Notes (Signed)
Cardiology Office Note:    Date:  01/28/2021   ID:  Ernest Bishop, DOB 08-09-1960, MRN [id number]  PCP:  Nelwyn Salisbury, MD   [facility] Providers Cardiologist:  Meriam Sprague, MD Cardiology APP:  Kennon Rounds    Referring MD: Nelwyn Salisbury, MD   Chief Complaint:  Follow-up for CAD    Patient Profile:   Ernest Bishop is a 60 y.o. male with:  Coronary artery disease  S/p NSTEMI in 12/2010 tx with DES to OM1 Known CTO of RCA S/p DES to OM1 and LCx in 09/2012 [place], [place]) S/p DES to OM1 and mLCx in 04/2017; residual oD1 90 (med Rx unless refractory angina) Long term DAPT Hypertension Hyperlipidemia GERD  History of Present Illness: Ernest Bishop was last seen in 4/21 via Telemedicine.  He returns for follow-up.  He is here alone.  He is doing well without chest pain, shortness of breath, syncope, orthopnea, leg edema.      ASSESSMENT & PLAN:   CAD (coronary artery disease) S/p non-STEMI in 2012 treated with a DES to the OM1.  He has undergone multiple PCI's since with DES to the OM1 and distal LCx in 2014, DES to the proximal LCx and ostial OM1 in 2019.  He has high-grade stenosis in a diagonal vessel that is managed medically unless he has refractory angina.  Since last seen, he has done well without recurrent anginal symptoms.  He continues to be able to do what ever he wants.  Continue current medications which include amlodipine 10 mg daily, aspirin 81 mg daily, atorvastatin 40 mg daily, clopidogrel 75 mg daily, metoprolol tartrate 50 mg twice daily.  He was previously followed by Dr. Delton See.  I will continue to follow him along with Dr. Shari Prows as needed.  Essential hypertension Blood pressure is well controlled.  Continue amlodipine 10 mg daily, HCTZ 12.5 mg daily, losartan 50 mg daily, metoprolol tartrate 50 mg twice daily.  Dyslipidemia Continue atorvastatin 40 mg daily.  Arrange fasting lipids and LFTs.  Dispo:  Return in about 1 year (around  01/28/2022) for Routine Follow Up, w/ Tereso Newcomer, PA-C.    Prior CV studies: Cardiac Catheterization 2017-04-17 D1 ost 90 LCx ost 50, prox 90; OM1 ost 95 ISR RCA prox 100 - CTO w/ R-L collaterals EF 55-65 PCI:  3.5 x 16 mm Synergy DES to prox LCx; 2.5 x 20 mm Synergy DES to OM1   Echocardiogram 01/19/11 Mild LVH, EF 50-55, normal wall motion, normal diastolic function, mild LAE, trivial pericardial effusion   Cardiac Catheterization 01/02/11 LAD okay LCx mild plaque, OM1 99 (culprit) RCA mid 100-CTO R-R collaterals treated medically EF 40 PCI: 2.5 x 60 mm Promus element DES to the OM1     Past Medical History:  Diagnosis Date   Alcohol use    Regular alcohol use, to cut down    CAD (coronary artery disease)    Non-STEMI January 02, 2011, DES to OM, residual chronically occluded small RCA with collaterals 1/19 PCI/DES to pLcx, and OM (3rd stent), normal EF   Dyslipidemia    Ejection fraction    EF 40% at the time of acute MI /  EF 55%, January 19, 2011, outpatient echo, no focal wall motion abnormalities.   GERD (gastroesophageal reflux disease)    Hiatal hernia    history   Hyperlipidemia    Hypertension    MI (myocardial infarction) (HCC)    Current Medications: Current Meds  Medication  Sig   aspirin 81 MG chewable tablet Chew 81 mg by mouth daily.   [DISCONTINUED] amLODipine (NORVASC) 10 MG tablet Take 1 tablet (10 mg total) by mouth daily. Please schedule appointment for future refills.   [DISCONTINUED] atorvastatin (LIPITOR) 40 MG tablet TAKE ONE TABLET BY MOUTH AT BEDTIME   [DISCONTINUED] clopidogrel (PLAVIX) 75 MG tablet Take 1 tablet (75 mg total) by mouth daily. Patient needs appointment for any future refills. Please call office at [phone] to schedule appointment. 2nd attempt.   [DISCONTINUED] hydrochlorothiazide (MICROZIDE) 12.5 MG capsule TAKE 1 CAPSULE BY MOUTH EVERY DAY   [DISCONTINUED] losartan (COZAAR) 50 MG tablet TAKE ONE TABLET BY MOUTH EVERY DAY    [DISCONTINUED] metoprolol tartrate (LOPRESSOR) 50 MG tablet TAKE ONE TABLET BY MOUTH TWICE DAILY   [DISCONTINUED] nitroGLYCERIN (NITROSTAT) 0.4 MG SL tablet Place 1 tablet (0.4 mg total) under the tongue every 5 (five) minutes as needed for chest pain.    Allergies:   Codeine and Hydrocodone   Social History   Tobacco Use   Smoking status: Former   Smokeless tobacco: Never  Building services engineer Use: Never used  Substance Use Topics   Alcohol use: Yes   Drug use: No    Family Hx: The patient's family history includes Alcohol abuse in his father; Arthritis in his mother; Hypertension in his brother and mother; Tremor in his mother.  Review of Systems  Cardiovascular:  Negative for claudication.    EKGs/Labs/Other Test Reviewed:    EKG:  EKG is  ordered today.  The ekg ordered today demonstrates sinus bradycardia, HR 58, normal axis, no ST-T wave changes, QTC 4 2  Recent Labs: 08/22/2020: BUN 11; Creatinine, Ser 0.98; Hemoglobin 15.7; Platelets 244; Potassium 3.8; Sodium 137   Recent Lipid Panel Lab Results  Component Value Date/Time   CHOL 197 02/12/2016 01:56 PM   TRIG 96 02/12/2016 01:56 PM   HDL 44 02/12/2016 01:56 PM   LDLCALC 134 (H) 02/12/2016 01:56 PM     Risk Assessment/Calculations:          Physical Exam:    VS:  BP 124/78   Pulse (!) 58   Ht 5\' 10"  (1.778 m)   Wt 228 lb (103.4 kg)   SpO2 96%   BMI 32.71 kg/m     Wt Readings from Last 3 Encounters:  01/28/21 228 lb (103.4 kg)  08/22/20 219 lb (99.3 kg)  08/02/19 223 lb (101.2 kg)    Constitutional:      Appearance: Healthy appearance. Not in distress.  Neck:     Vascular: No carotid bruit. JVD normal.  Pulmonary:     Effort: Pulmonary effort is normal.     Breath sounds: No wheezing. No rales.  Cardiovascular:     Normal rate. Regular rhythm. Normal S1. Normal S2.      Murmurs: There is no murmur.  Edema:    Peripheral edema absent.  Abdominal:     Palpations: Abdomen is soft. There is no  hepatomegaly.  Skin:    General: Skin is warm and dry.  Neurological:     General: No focal deficit present.     Mental Status: Alert and oriented to person, place and time.     Cranial Nerves: Cranial nerves are intact.     Medication Adjustments/Labs and Tests Ordered: Current medicines are reviewed at length with the patient today.  Concerns regarding medicines are outlined above.  Tests Ordered: Orders Placed This Encounter  Procedures   Lipid  Profile   Hepatic function panel   EKG 12-Lead    Medication Changes: Meds ordered this encounter  Medications   amLODipine (NORVASC) 10 MG tablet    Sig: Take 1 tablet (10 mg total) by mouth daily. Please schedule appointment for future refills.    Dispense:  90 tablet    Refill:  3   atorvastatin (LIPITOR) 40 MG tablet    Sig: Take 1 tablet (40 mg total) by mouth at bedtime.    Dispense:  90 tablet    Refill:  3   clopidogrel (PLAVIX) 75 MG tablet    Sig: Take 1 tablet (75 mg total) by mouth daily.    Dispense:  90 tablet    Refill:  3   hydrochlorothiazide (MICROZIDE) 12.5 MG capsule    Sig: TAKE 1 CAPSULE BY MOUTH EVERY DAY    Dispense:  45 capsule    Refill:  3   losartan (COZAAR) 50 MG tablet    Sig: Take 1 tablet (50 mg total) by mouth daily.    Dispense:  90 tablet    Refill:  3   metoprolol tartrate (LOPRESSOR) 50 MG tablet    Sig: Take 1 tablet (50 mg total) by mouth 2 (two) times daily.    Dispense:  180 tablet    Refill:  3   nitroGLYCERIN (NITROSTAT) 0.4 MG SL tablet    Sig: Place 1 tablet (0.4 mg total) under the tongue every 5 (five) minutes as needed for chest pain.    Dispense:  25 tablet    Refill:  3    Signed, Tereso Newcomer, PA-C  01/28/2021 5:15 PM    [facility] [address] [place], [place], [place]  [address] Phone: [phone]; Fax: [phone]

## 2021-01-28 NOTE — Patient Instructions (Signed)
Medication Instructions:   Your physician recommends that you continue on your current medications as directed. Please refer to the Current Medication list given to you today.  *If you need a refill on your cardiac medications before your next appointment, please call your pharmacy*   Lab Work:  Fasting labs in [place]. Patient given paperwork today for [facility].  If you have labs (blood work) drawn today and your tests are completely normal, you will receive your results only by: MyChart Message (if you have MyChart) OR A paper copy in the mail If you have any lab test that is abnormal or we need to change your treatment, we will call you to review the results.   Testing/Procedures:  -NONE   Follow-Up: At [facility], you and your health needs are our priority.  As part of our continuing mission to provide you with exceptional heart care, we have created designated Provider Care Teams.  These Care Teams include your primary Cardiologist (physician) and Advanced Practice Providers (APPs -  Physician Assistants and Nurse Practitioners) who all work together to provide you with the care you need, when you need it.  We recommend signing up for the patient portal called "MyChart".  Sign up information is provided on this After Visit Summary.  MyChart is used to connect with patients for Virtual Visits (Telemedicine).  Patients are able to view lab/test results, encounter notes, upcoming appointments, etc.  Non-urgent messages can be sent to your provider as well.   To learn more about what you can do with MyChart, go to [email or web address].    Your next appointment:   1 year(s)  The format for your next appointment:   In Person  Provider:   Tereso Newcomer, PA-C   Other Instructions  Your physician wants you to follow-up in: 1 year with Tereso Newcomer, PA-C. You will receive a reminder letter in the mail two months in advance. If you don't receive a letter, please call our  office to schedule the follow-up appointment.

## 2021-01-28 NOTE — Assessment & Plan Note (Addendum)
S/p non-STEMI in 2012 treated with a DES to the OM1.  He has undergone multiple PCI's since with DES to the OM1 and distal LCx in 2014, DES to the proximal LCx and ostial OM1 in 2019.  He has high-grade stenosis in a diagonal vessel that is managed medically unless he has refractory angina.  Since last seen, he has done well without recurrent anginal symptoms.  He continues to be able to do what ever he wants.  Continue current medications which include amlodipine 10 mg daily, aspirin 81 mg daily, atorvastatin 40 mg daily, clopidogrel 75 mg daily, metoprolol tartrate 50 mg twice daily.  He was previously followed by Dr. Delton See.  I will continue to follow him along with Dr. Shari Prows as needed.

## 2021-12-26 IMAGING — DX DG FOREARM 2V*L*
2 series · 2 of 2 positions shown · non-contrast
Comparison: None.

CLINICAL DATA: Animal bite

EXAM:
LEFT FOREARM - 2 VIEW

[forearm ap]
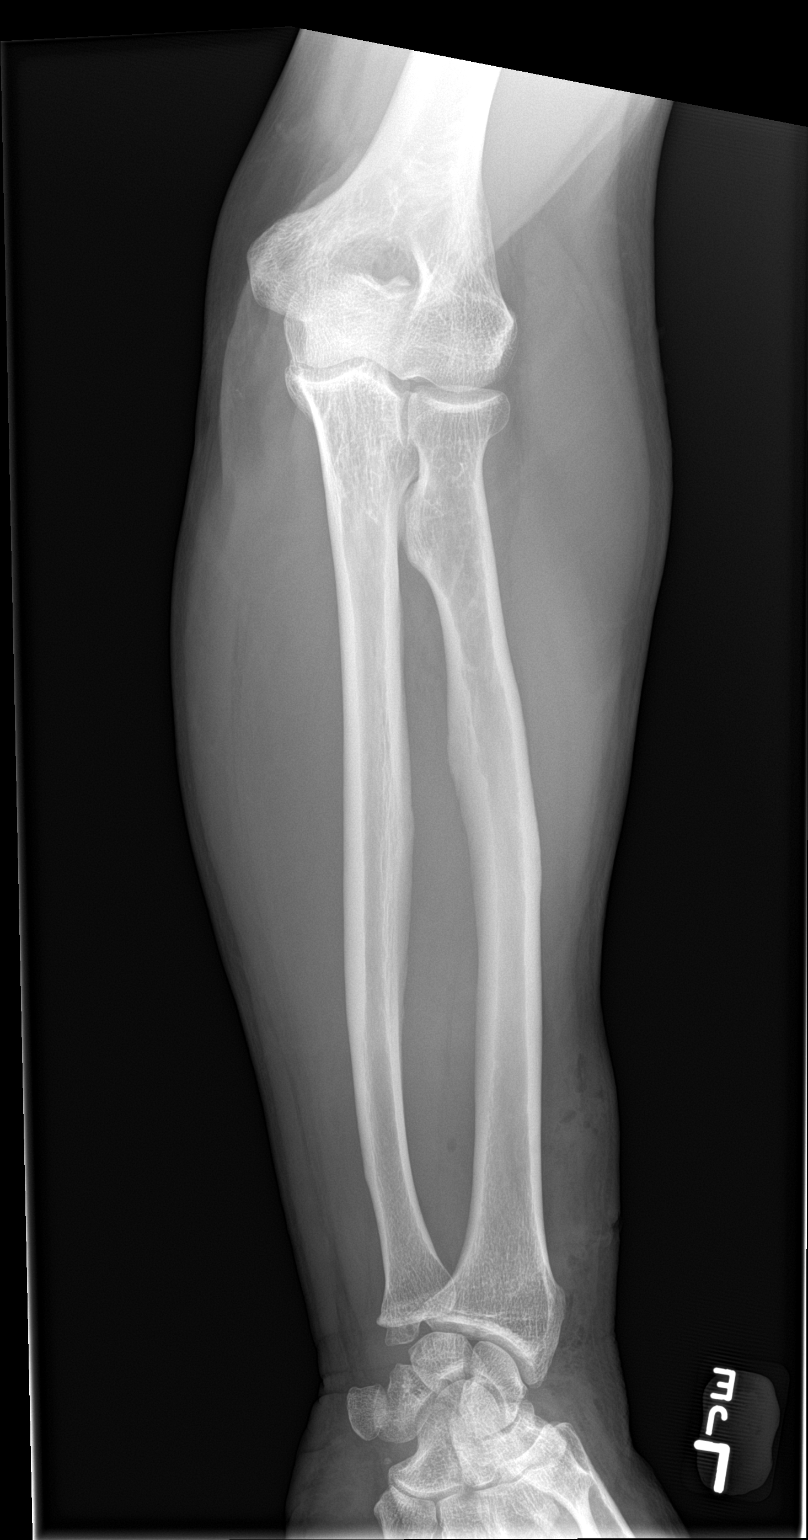

[forearm lat]
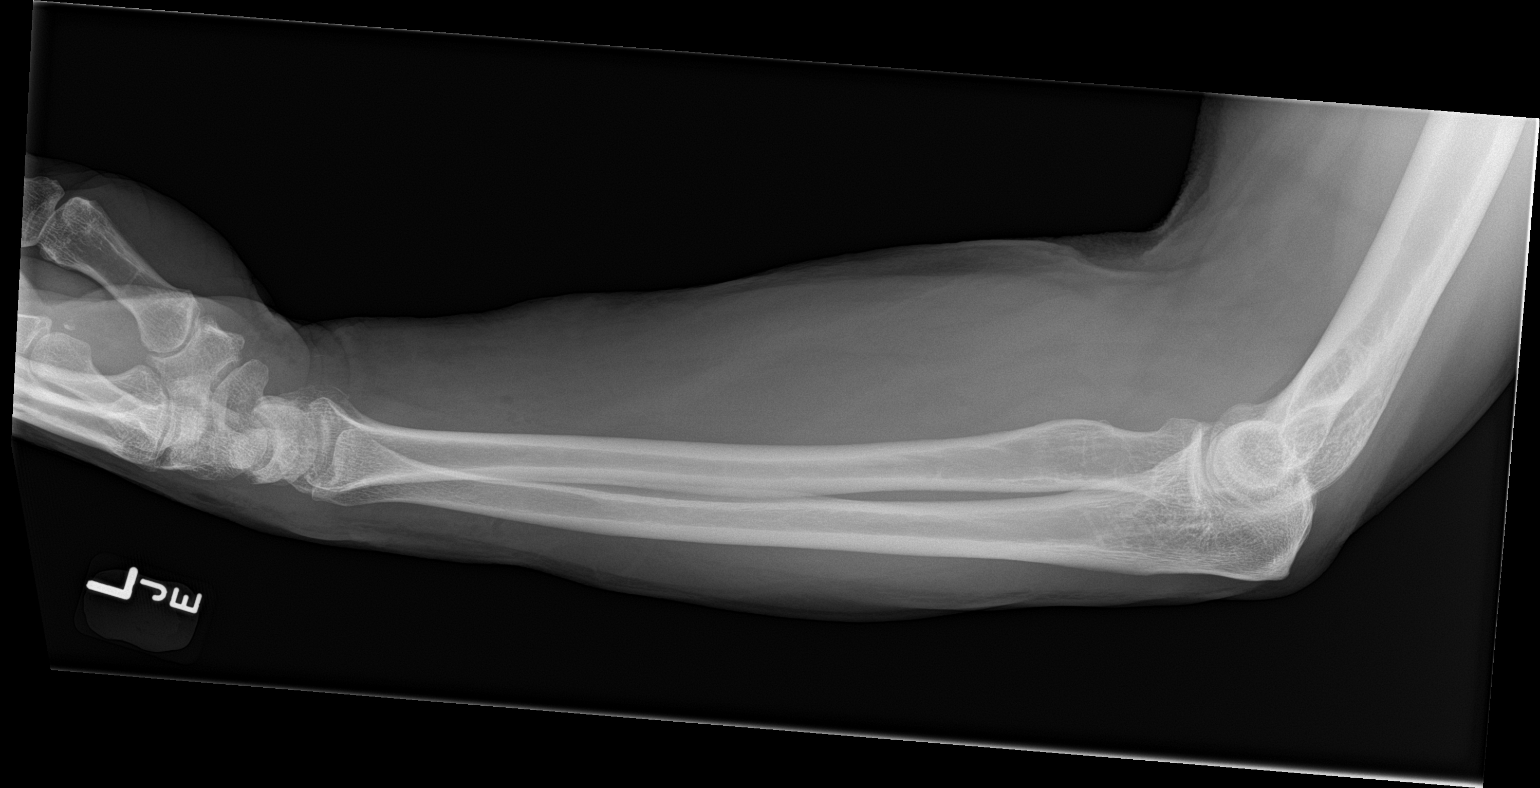

[2 of 2 positions shown; findings below may reference images not displayed]

FINDINGS: No malalignment. Questionable volar cortex discontinuity at the
distal radius. Gas within the soft tissues of the distal forearm. No
radiopaque foreign body
IMPRESSION: 1. Questionable cortex discontinuity volar aspect of distal radius,
recommend dedicated views of the wrist.
2. Gas in the soft tissues consistent with history of animal bite

## 2022-02-17 ENCOUNTER — Other Ambulatory Visit: Payer: Self-pay | Admitting: Physician Assistant

## 2022-02-17 DIAGNOSIS — I10 Essential (primary) hypertension: Secondary | ICD-10-CM

## 2022-02-17 DIAGNOSIS — E785 Hyperlipidemia, unspecified: Secondary | ICD-10-CM

## 2022-02-17 DIAGNOSIS — I2510 Atherosclerotic heart disease of native coronary artery without angina pectoris: Secondary | ICD-10-CM

## 2022-02-18 NOTE — Telephone Encounter (Signed)
Yes. Please refill Plavix. He is to remain on ASA and Plavix long term. Tereso Newcomer, PA-C    02/18/2022 8:56 AM

## 2022-07-02 ENCOUNTER — Telehealth: Payer: Self-pay | Admitting: Cardiology

## 2022-07-02 DIAGNOSIS — I10 Essential (primary) hypertension: Secondary | ICD-10-CM

## 2022-07-02 DIAGNOSIS — I2510 Atherosclerotic heart disease of native coronary artery without angina pectoris: Secondary | ICD-10-CM

## 2022-07-02 DIAGNOSIS — E785 Hyperlipidemia, unspecified: Secondary | ICD-10-CM

## 2022-07-02 MED ORDER — AMLODIPINE BESYLATE 10 MG PO TABS: 10.0000 mg | ORAL_TABLET | Freq: Every day | ORAL | 1 refills | Status: DC

## 2022-07-02 MED ORDER — CLOPIDOGREL BISULFATE 75 MG PO TABS: 75.0000 mg | ORAL_TABLET | Freq: Every day | ORAL | 1 refills | Status: DC

## 2022-07-02 MED ORDER — HYDROCHLOROTHIAZIDE 12.5 MG PO CAPS: ORAL_CAPSULE | ORAL | 1 refills | Status: DC

## 2022-07-02 MED ORDER — LOSARTAN POTASSIUM 50 MG PO TABS: 50.0000 mg | ORAL_TABLET | Freq: Every day | ORAL | 1 refills | Status: DC

## 2022-07-02 MED ORDER — ATORVASTATIN CALCIUM 40 MG PO TABS: 40.0000 mg | ORAL_TABLET | Freq: Every day | ORAL | 1 refills | Status: DC

## 2022-07-02 NOTE — Telephone Encounter (Signed)
Addition to previous note - Patient has appointment scheduled on 5/15.

## 2022-07-02 NOTE — Telephone Encounter (Signed)
*  STAT* If patient is at the pharmacy, call can be transferred to refill team.   1. Which medications need to be refilled? (please list name of each medication and dose if known)   hydrochlorothiazide (MICROZIDE) 12.5 MG capsule  amLODipine (NORVASC) 10 MG tablet (Expired)  atorvastatin (LIPITOR) 40 MG tablet  losartan (COZAAR) 50 MG tablet  clopidogrel (PLAVIX) 75 MG tablet    2. Which pharmacy/location (including street and city if local pharmacy) is medication to be sent to?  [place],  - [address]   3. Do they need a 30 day or 90 day supply?   90 day  Patient stated he still has some of this medication left.

## 2022-07-02 NOTE — Telephone Encounter (Signed)
Pt's medications were sent to pt's pharmacy as requested. Confirmation received.  

## 2022-08-19 ENCOUNTER — Ambulatory Visit: Payer: Self-pay | Admitting: Physician Assistant

## 2022-08-25 ENCOUNTER — Other Ambulatory Visit: Payer: Self-pay | Admitting: Physician Assistant

## 2022-10-16 ENCOUNTER — Other Ambulatory Visit: Payer: Self-pay | Admitting: Physician Assistant

## 2022-11-09 ENCOUNTER — Telehealth: Payer: Self-pay | Admitting: Physician Assistant

## 2022-11-09 NOTE — Telephone Encounter (Signed)
If appt was for routine follow up, we can reschedule for a few weeks from now and make sure he has all refills. Tereso Newcomer, PA-C    11/09/2022 1:59 PM

## 2022-11-09 NOTE — Telephone Encounter (Signed)
Pt is requesting a callback regarding his appt on 8/7. He stated he broke his foot so can't drive but before he cancels his appt he'd like to know if a virtual appt with Alben Spittle would be an option. Please advise.

## 2022-11-09 NOTE — Telephone Encounter (Signed)
Returned call to pt, left a message for him to call back.

## 2022-11-10 ENCOUNTER — Ambulatory Visit: Payer: Self-pay | Admitting: Physician Assistant

## 2022-11-10 NOTE — Progress Notes (Deleted)
Cardiology Office Note:    Date:  11/10/2022  ID:  Ernest Bishop, DOB 07/21/1960, MRN [id number] PCP: Nelwyn Salisbury, MD  [facility] Providers Cardiologist:  Meriam Sprague, MD Cardiology APP:  Beatrice Lecher, PA-C { Click to update primary MD,subspecialty MD or APP then REFRESH:1}    {Click to Open Review  :1}   Patient Profile:      Coronary artery disease  S/p NSTEMI in 12/2010 tx with 2.5 x 60 mm DES to OM1 Known CTO of RCA S/p DES to OM1 and LCx in 09/2012 [facility] [place]) S/p 2.5 x 20 mm DES to OM1 and 3.5 x 16 mm mLCx in 04/2017 [facility] 04/13/2017: Ostial D1 90, ostial LCx 50, proximal LCx 90, OM1 95 ISR, proximal RCA 100 CTO, EF 55-65 >> residual D1 stenosis Rx med unless refractory angina Long term DAPT TTE 01/19/2011: Mild LVH, EF 50-55, normal wall motion, normal diastolic function, mild LAE, trivial pericardial effusion  Hypertension Hyperlipidemia GERD       {Last seen in October 2022 previous Dr. Delton See patient was supposed to establish with Dr. Shari Prows routine annual follow-up   :1}     Discussed the use of AI scribe software for clinical note transcription with the patient, who gave verbal consent to proceed.  History of Present Illness          ROS: ***    Studies Reviewed:       *** Risk Assessment/Calculations:   {Does this patient have ATRIAL FIBRILLATION?[phone]} No BP recorded.  {Refresh Note OR Click here to enter BP  :1}***       Physical Exam:   VS:  There were no vitals taken for this visit.   Wt Readings from Last 3 Encounters:  01/28/21 228 lb (103.4 kg)  08/22/20 219 lb (99.3 kg)  08/02/19 223 lb (101.2 kg)    GEN: Well nourished, well developed in no acute distress*** NECK: No JVD CARDIAC: ***RRR, no murmurs, rubs, gallops RESPIRATORY:  Clear to auscultation without rales, wheezing or rhonchi  ABDOMEN: Soft EXTREMITIES:  No edema     Assessment and Plan:  No problem-specific Assessment & Plan notes found for this  encounter. Assessment and Plan          {CAD (coronary artery disease) S/p non-STEMI in 2012 treated with a DES to the OM1.  He has undergone multiple PCI's since with DES to the OM1 and distal LCx in 2014, DES to the proximal LCx and ostial OM1 in 2019.  He has high-grade stenosis in a diagonal vessel that is managed medically unless he has refractory angina.  Since last seen, he has done well without recurrent anginal symptoms.  He continues to be able to do what ever he wants.  Continue current medications which include amlodipine 10 mg daily, aspirin 81 mg daily, atorvastatin 40 mg daily, clopidogrel 75 mg daily, metoprolol tartrate 50 mg twice daily.  He was previously followed by Dr. Delton See.  I will continue to follow him along with Dr. Shari Prows as needed.   Essential hypertension Blood pressure is well controlled.  Continue amlodipine 10 mg daily, HCTZ 12.5 mg daily, losartan 50 mg daily, metoprolol tartrate 50 mg twice daily.   Dyslipidemia Continue atorvastatin 40 mg daily.  Arrange fasting lipids and LFTs.   Dispo:  Return in about 1 year (around 01/28/2022) for Routine Follow Up, w/ Tereso Newcomer, PA-C.       :1}    {Are you ordering a CV  Procedure (e.g. stress test, cath, DCCV, TEE, etc)?   Press F2        :[id number]}  Dispo:  No follow-ups on file.  Signed, Tereso Newcomer, PA-C

## 2022-11-11 ENCOUNTER — Ambulatory Visit: Payer: Self-pay | Admitting: Physician Assistant

## 2022-11-11 DIAGNOSIS — I25119 Atherosclerotic heart disease of native coronary artery with unspecified angina pectoris: Secondary | ICD-10-CM

## 2022-11-11 NOTE — Telephone Encounter (Signed)
Patient is requesting call back to discuss having video appt. He states that with his broken foot and other issues going on with his insurance that a video visit would suffice since he does not think he has other heart related issues going on. Requesting call back to discuss.

## 2022-11-11 NOTE — Telephone Encounter (Signed)
Left a message to call back.

## 2022-11-23 NOTE — Telephone Encounter (Signed)
Pt has been scheduled to see [facility] 12/09/22.

## 2022-12-09 ENCOUNTER — Ambulatory Visit: Payer: Self-pay | Admitting: Physician Assistant

## 2022-12-14 ENCOUNTER — Other Ambulatory Visit: Payer: Self-pay | Admitting: Physician Assistant

## 2022-12-14 ENCOUNTER — Other Ambulatory Visit: Payer: Self-pay

## 2022-12-14 DIAGNOSIS — I10 Essential (primary) hypertension: Secondary | ICD-10-CM

## 2022-12-14 DIAGNOSIS — E785 Hyperlipidemia, unspecified: Secondary | ICD-10-CM

## 2022-12-14 DIAGNOSIS — I2510 Atherosclerotic heart disease of native coronary artery without angina pectoris: Secondary | ICD-10-CM

## 2022-12-14 NOTE — Telephone Encounter (Signed)
Pt calling requesting a refill on his medication until appt time in 11/24 with Tereso Newcomer, PA. Pt has cancelled numerous appt and pt was very irate and angry when I tried to explain to him that I needed to contact the provider to see if he would refill these medications until new appt in November 2024. Would Tereso Newcomer, PA like to refill these medications until appt time? Please address

## 2022-12-15 NOTE — Telephone Encounter (Signed)
Pt has an upcoming appt in November 2024, pt has cancelled numerous appt with Tereso Newcomer, PA. Last office visit was 01/29/2021. Would Tereso Newcomer, PA like to refill these medications until the next appointment time or does pt need to be seen sooner? Please address

## 2022-12-15 NOTE — Telephone Encounter (Signed)
No follow up since 01/2021. Pt needs office follow up visit. Tereso Newcomer, PA-C    12/15/2022 5:57 PM

## 2022-12-16 ENCOUNTER — Telehealth: Payer: Self-pay | Admitting: Physician Assistant

## 2022-12-16 DIAGNOSIS — E785 Hyperlipidemia, unspecified: Secondary | ICD-10-CM

## 2022-12-16 DIAGNOSIS — I2510 Atherosclerotic heart disease of native coronary artery without angina pectoris: Secondary | ICD-10-CM

## 2022-12-16 DIAGNOSIS — I10 Essential (primary) hypertension: Secondary | ICD-10-CM

## 2022-12-16 MED ORDER — ATORVASTATIN CALCIUM 40 MG PO TABS: 40.0000 mg | ORAL_TABLET | Freq: Every day | ORAL | 0 refills | Status: DC

## 2022-12-16 MED ORDER — LOSARTAN POTASSIUM 50 MG PO TABS: 50.0000 mg | ORAL_TABLET | Freq: Every day | ORAL | 0 refills | Status: DC

## 2022-12-16 MED ORDER — NITROGLYCERIN 0.4 MG SL SUBL: 0.4000 mg | SUBLINGUAL_TABLET | SUBLINGUAL | 0 refills | Status: DC | PRN

## 2022-12-16 MED ORDER — METOPROLOL TARTRATE 50 MG PO TABS: 50.0000 mg | ORAL_TABLET | Freq: Two times a day (BID) | ORAL | 0 refills | Status: DC

## 2022-12-16 MED ORDER — HYDROCHLOROTHIAZIDE 12.5 MG PO CAPS: ORAL_CAPSULE | ORAL | 0 refills | Status: DC

## 2022-12-16 MED ORDER — AMLODIPINE BESYLATE 10 MG PO TABS: 10.0000 mg | ORAL_TABLET | Freq: Every day | ORAL | 0 refills | Status: DC

## 2022-12-16 MED ORDER — CLOPIDOGREL BISULFATE 75 MG PO TABS: 75.0000 mg | ORAL_TABLET | Freq: Every day | ORAL | 0 refills | Status: DC

## 2022-12-16 NOTE — Telephone Encounter (Signed)
*  STAT* If patient is at the pharmacy, call can be transferred to refill team.   1. Which medications need to be refilled? (please list name of each medication and dose if known)           amLODipine (NORVASC) 10 MG tablet   losartan (COZAAR) 50 MG tablet  metoprolol tartrate (LOPRESSOR) 50 MG tablet  atorvastatin (LIPITOR) 40 MG tablet   hydrochlorothiazide (MICROZIDE) 12.5 MG capsule    clopidogrel (PLAVIX) 75 MG tablet   2. Which pharmacy/location (including street and city if local pharmacy) is medication to be sent to? [facility] - [place], [place] - [address]    3. Do they need a 30 day or 90 day supply? 30 day

## 2022-12-16 NOTE — Telephone Encounter (Signed)
Call placed to pt.  He understands he was in the wrong for cancelling his appointments.  Pt wanted to see Tereso Newcomer back, so scheduled him for 9/23.  Pt aware will send in 12 days supply of meds until appt.   He was thankful.

## 2022-12-27 NOTE — Progress Notes (Unsigned)
Cardiology Office Note:    Date:  12/28/2022  ID:  Ernest Bishop, DOB 04/02/1961, MRN [id number] PCP: Nelwyn Salisbury, MD  [facility] Providers Cardiologist:  Meriam Sprague, MD Cardiology APP:  Beatrice Lecher, PA-C       Patient Profile:      Coronary artery disease  S/p NSTEMI in 12/2010 tx with 2.5 x 16 mm DES to OM1 Known CTO of RCA TTE 01/19/2011: Mild LVH, EF 50-55, normal wall motion, normal diastolic function, mild LAE, trivial pericardial effusion  S/p DES to OM1 and LCx in 09/2012 [facility], [place]) S/p 2.5 x 20 mm DES to OM1 and 3.5 x 16 mm DES to mLCx in 04/2017 residual oD1 90 (med Rx unless refractory angina); pRCA 100 CTO with R-L collats Long term DAPT Hypertension Hyperlipidemia GERD         History of Present Illness:  Discussed the use of AI scribe software for clinical note transcription with the patient, who gave verbal consent to proceed.  Ernest Bishop is a 62 y.o. male who returns for follow-up of coronary artery disease.  He was last seen in October 2022.  He is here alone. The patient has been struggling with financial hardship, leading to high levels of stress and anxiety. He has had difficulty affording his medications and did miss them for about three weeks. He resumed his medications a week prior to the current visit. Despite these challenges, the patient reports no new or worsening cardiac symptoms. He denies experiencing chest pain, shortness of breath, lightheadedness, or swelling in his legs, except for some swelling in his right foot, which he attributes to a previous fracture.     ROS: See HPI. No melena, hematochezia, vomiting, diarrhea.    Studies Reviewed:   EKG Interpretation Date/Time:  Monday December 28 2022 09:00:49 EDT Ventricular Rate:  91 PR Interval:  174 QRS Duration:  96 QT Interval:  354 QTC Calculation: 435 R Axis:   -11  Text Interpretation: Normal sinus rhythm Septal Q waves No significant change since last  tracing Confirmed by Tereso Newcomer [phone]) on 12/28/2022 9:04:27 AM    Risk Assessment/Calculations:             Physical Exam:   VS:  BP 132/80   Pulse 91   Ht 5\' 10"  (1.778 m)   Wt 221 lb 3.2 oz (100.3 kg)   SpO2 93%   BMI 31.74 kg/m    Wt Readings from Last 3 Encounters:  12/28/22 221 lb 3.2 oz (100.3 kg)  01/28/21 228 lb (103.4 kg)  08/22/20 219 lb (99.3 kg)    Constitutional:      Appearance: Healthy appearance. Not in distress.  Neck:     Vascular: No carotid bruit. JVD normal.  Pulmonary:     Breath sounds: Normal breath sounds. No wheezing. No rales.  Cardiovascular:     Normal rate. Regular rhythm.     Murmurs: There is no murmur.  Edema:    Peripheral edema absent.  Abdominal:     Palpations: Abdomen is soft.  Psychiatric:        Mood and Affect: Mood is anxious.      Assessment and Plan:     Coronary Artery Disease History of non-STEMI in 2012 treated with DES to the OM1, subsequent DES to the OM1 and LCx in 2014 in [place] DES to the OM1 and DES to the LCx in 2019.  He has a history of chronically occluded RCA  with right to left collaterals treated medically.  He is doing well without anginal symptoms.  -Continue current medications:Amlodipine 10mg  daily, Aspirin 81mg  daily, Atorvastatin 40mg  daily, Plavix 75mg  daily, Metoprolol tartrate 50mg  twice daily, and Nitroglycerin as needed. -Follow-up 1 year  Hypertension Well controlled on current regimen. -Continue current medications:Amlodipine 10mg  daily, Hydrochlorothiazide 12.5mg  daily, Losartan 50mg  daily, Metoprolol tartrate 50mg  twice daily.  Hyperlipidemia On Atorvastatin 40mg  daily. -Obtain CMET, fasting lipids today  Stress/Anxiety High stress levels due to financial constraints and lack of medical insurance. -Refer to Social Work for assistance with [employer] and potential financial aid programs.         Dispo:  Return in about 1 year (around 12/28/2023) for Routine Follow Up,  w/ Tereso Newcomer, PA-C.  Signed, Tereso Newcomer, PA-C

## 2022-12-28 ENCOUNTER — Telehealth: Payer: Self-pay | Admitting: Licensed Clinical Social Worker

## 2022-12-28 ENCOUNTER — Ambulatory Visit: Payer: Self-pay | Attending: Physician Assistant | Admitting: Physician Assistant

## 2022-12-28 ENCOUNTER — Encounter: Payer: Self-pay | Admitting: Physician Assistant

## 2022-12-28 ENCOUNTER — Other Ambulatory Visit (HOSPITAL_COMMUNITY): Payer: Self-pay

## 2022-12-28 VITALS — BP 132/80 | HR 91 | Ht 70.0 in | Wt 221.2 lb

## 2022-12-28 DIAGNOSIS — I2510 Atherosclerotic heart disease of native coronary artery without angina pectoris: Secondary | ICD-10-CM

## 2022-12-28 DIAGNOSIS — E785 Hyperlipidemia, unspecified: Secondary | ICD-10-CM

## 2022-12-28 DIAGNOSIS — F439 Reaction to severe stress, unspecified: Secondary | ICD-10-CM

## 2022-12-28 DIAGNOSIS — I10 Essential (primary) hypertension: Secondary | ICD-10-CM

## 2022-12-28 LAB — COMPREHENSIVE METABOLIC PANEL
ALT: 17 IU/L (ref 0–44)
AST: 16 IU/L (ref 0–40)
Albumin: 4.8 g/dL (ref 3.9–4.9)
Alkaline Phosphatase: 109 IU/L (ref 44–121)
BUN/Creatinine Ratio: 14 (ref 10–24)
BUN: 12 mg/dL (ref 8–27)
Bilirubin Total: 0.5 mg/dL (ref 0.0–1.2)
CO2: 22 mmol/L (ref 20–29)
Calcium: 9.6 mg/dL (ref 8.6–10.2)
Chloride: 104 mmol/L (ref 96–106)
Creatinine, Ser: 0.86 mg/dL (ref 0.76–1.27)
Globulin, Total: 2.6 g/dL (ref 1.5–4.5)
Glucose: 106 mg/dL — ABNORMAL HIGH (ref 70–99)
Potassium: 4.3 mmol/L (ref 3.5–5.2)
Sodium: 138 mmol/L (ref 134–144)
Total Protein: 7.4 g/dL (ref 6.0–8.5)
eGFR: 99 mL/min/{1.73_m2} (ref >59)

## 2022-12-28 LAB — LIPID PANEL
Chol/HDL Ratio: 2.8 ratio (ref 0.0–5.0)
Cholesterol, Total: 150 mg/dL (ref 100–199)
HDL: 53 mg/dL (ref >39)
LDL Chol Calc (NIH): 78 mg/dL (ref 0–99)
Triglycerides: 106 mg/dL (ref 0–149)
VLDL Cholesterol Cal: 19 mg/dL (ref 5–40)

## 2022-12-28 MED ORDER — ATORVASTATIN CALCIUM 40 MG PO TABS: 40.0000 mg | ORAL_TABLET | Freq: Every day | ORAL | 3 refills | Status: DC

## 2022-12-28 MED ORDER — CLOPIDOGREL BISULFATE 75 MG PO TABS
75.0000 mg | ORAL_TABLET | Freq: Every day | ORAL | 0 refills | Status: DC
  Filled 2022-12-28: qty 30, 30d supply, fill #0

## 2022-12-28 MED ORDER — NITROGLYCERIN 0.4 MG SL SUBL: 0.4000 mg | SUBLINGUAL_TABLET | SUBLINGUAL | 3 refills | Status: AC | PRN

## 2022-12-28 MED ORDER — LOSARTAN POTASSIUM 50 MG PO TABS: 50.0000 mg | ORAL_TABLET | Freq: Every day | ORAL | 3 refills | Status: AC

## 2022-12-28 MED ORDER — LOSARTAN POTASSIUM 50 MG PO TABS
50.0000 mg | ORAL_TABLET | Freq: Every day | ORAL | 0 refills | Status: DC
  Filled 2022-12-28: qty 30, 30d supply, fill #0

## 2022-12-28 MED ORDER — AMLODIPINE BESYLATE 10 MG PO TABS
10.0000 mg | ORAL_TABLET | Freq: Every day | ORAL | 0 refills | Status: DC
  Filled 2022-12-28: qty 30, 30d supply, fill #0

## 2022-12-28 MED ORDER — AMLODIPINE BESYLATE 10 MG PO TABS: 10.0000 mg | ORAL_TABLET | Freq: Every day | ORAL | 3 refills | Status: DC

## 2022-12-28 MED ORDER — HYDROCHLOROTHIAZIDE 12.5 MG PO TABS
12.5000 mg | ORAL_TABLET | Freq: Every day | ORAL | 0 refills | Status: DC
  Filled 2022-12-28: qty 30, 30d supply, fill #0

## 2022-12-28 MED ORDER — ATORVASTATIN CALCIUM 40 MG PO TABS
40.0000 mg | ORAL_TABLET | Freq: Every day | ORAL | 0 refills | Status: DC
  Filled 2022-12-28: qty 30, 30d supply, fill #0

## 2022-12-28 MED ORDER — CLOPIDOGREL BISULFATE 75 MG PO TABS: 75.0000 mg | ORAL_TABLET | Freq: Every day | ORAL | 3 refills | Status: DC

## 2022-12-28 MED ORDER — METOPROLOL TARTRATE 50 MG PO TABS
50.0000 mg | ORAL_TABLET | Freq: Two times a day (BID) | ORAL | 0 refills | Status: DC
  Filled 2022-12-28: qty 60, 30d supply, fill #0

## 2022-12-28 MED ORDER — NITROGLYCERIN 0.4 MG SL SUBL
SUBLINGUAL_TABLET | Freq: Every day | SUBLINGUAL | 0 refills | Status: DC
  Filled 2022-12-28: qty 25, 25d supply, fill #0

## 2022-12-28 MED ORDER — METOPROLOL TARTRATE 50 MG PO TABS: 50.0000 mg | ORAL_TABLET | Freq: Two times a day (BID) | ORAL | 3 refills | Status: DC

## 2022-12-28 MED ORDER — HYDROCHLOROTHIAZIDE 12.5 MG PO CAPS: ORAL_CAPSULE | ORAL | 3 refills | Status: DC

## 2022-12-28 NOTE — Telephone Encounter (Signed)
H&V Care Navigation CSW Progress Note  Clinical Social Worker contacted patient by phone to f/u on referral for assistance as pt is uninsured. No answer at [phone], left voicemail. Will re-attempt as able.   Patient is participating in a Managed Medicaid Plan:  No, self pay only  SDOH Screenings   Tobacco Use: Medium Risk (12/28/2022)   Octavio Graves, MSW, LCSW Clinical Social Worker II [facility] Heart/Vascular Care Navigation  [phone]- work cell phone (preferred) [phone]- desk phone

## 2022-12-28 NOTE — Patient Instructions (Signed)
Medication Instructions:  Your physician recommends that you continue on your current medications as directed. Please refer to the Current Medication list given to you today.  *If you need a refill on your cardiac medications before your next appointment, please call your pharmacy*   Lab Work: TODAY:  CMET & LIPID   If you have labs (blood work) drawn today and your tests are completely normal, you will receive your results only by: MyChart Message (if you have MyChart) OR A paper copy in the mail If you have any lab test that is abnormal or we need to change your treatment, we will call you to review the results.   Testing/Procedures: None ordered   Follow-Up: At [facility], you and your health needs are our priority.  As part of our continuing mission to provide you with exceptional heart care, we have created designated Provider Care Teams.  These Care Teams include your primary Cardiologist (physician) and Advanced Practice Providers (APPs -  Physician Assistants and Nurse Practitioners) who all work together to provide you with the care you need, when you need it.  We recommend signing up for the patient portal called "MyChart".  Sign up information is provided on this After Visit Summary.  MyChart is used to connect with patients for Virtual Visits (Telemedicine).  Patients are able to view lab/test results, encounter notes, upcoming appointments, etc.  Non-urgent messages can be sent to your provider as well.   To learn more about what you can do with MyChart, go to [email or web address].    Your next appointment:   12 month(s)  Provider:   Meriam Sprague, MD  or  Tereso Newcomer, PA-C         Other Instructions We have placed a referral for our Social Work dpeartment to reach out to you to see about some assistance with Insurance / Medications.

## 2022-12-29 ENCOUNTER — Telehealth (HOSPITAL_BASED_OUTPATIENT_CLINIC_OR_DEPARTMENT_OTHER): Payer: Self-pay | Admitting: Licensed Clinical Social Worker

## 2022-12-29 NOTE — Telephone Encounter (Signed)
H&V Care Navigation CSW Progress Note  Clinical Social Worker contacted patient by phone to f/u on referral for insurance and SDOH assistance. Was able to reach pt today at [phone]. Pt shares he is at work, requests I call back later today at 4pm when he is off. Will re-attempt at that time.   Patient is participating in a Managed Medicaid Plan:  No, self pay only  SDOH Screenings   Tobacco Use: Medium Risk (12/28/2022)   Octavio Graves, MSW, LCSW Clinical Social Worker II [facility] Heart/Vascular Care Navigation  [phone]- work cell phone (preferred) [phone]- desk phone

## 2023-01-01 ENCOUNTER — Telehealth: Payer: Self-pay | Admitting: *Deleted

## 2023-01-01 DIAGNOSIS — Z79899 Other long term (current) drug therapy: Secondary | ICD-10-CM

## 2023-01-01 DIAGNOSIS — E785 Hyperlipidemia, unspecified: Secondary | ICD-10-CM

## 2023-01-01 MED ORDER — ATORVASTATIN CALCIUM 80 MG PO TABS: 80.0000 mg | ORAL_TABLET | Freq: Every day | ORAL | 3 refills | Status: DC

## 2023-01-01 NOTE — Progress Notes (Signed)
Pt has been made aware of normal result and verbalized understanding.  jw

## 2023-01-01 NOTE — Telephone Encounter (Signed)
-----   Message from Tereso Newcomer sent at 01/01/2023  7:49 AM EDT ----- Patient did not view MyChart comments.  Please notify patient of results. Tereso Newcomer, PA-C    01/01/2023 7:49 AM

## 2023-01-04 ENCOUNTER — Ambulatory Visit: Payer: Self-pay | Admitting: Physician Assistant

## 2023-01-06 ENCOUNTER — Telehealth (HOSPITAL_BASED_OUTPATIENT_CLINIC_OR_DEPARTMENT_OTHER): Payer: Self-pay | Admitting: Licensed Clinical Social Worker

## 2023-01-06 NOTE — Telephone Encounter (Signed)
H&V Care Navigation CSW Progress Note  Clinical Social Worker contacted patient by phone to f/u on assistance applications. He shared via text he is over the Medicaid guidelines and thanked me for sending them. I had also sent the [employer] applications and [employer]. These two programs have different income guidelines therefore pt may qualify if his income is in those alternate income limits. Pt will review with wife and let me know if he is interested in pursuing any additional assistance programs.  Patient is participating in a Managed Medicaid Plan:  No, self pay only  SDOH Screenings   Food Insecurity: No Food Insecurity (12/29/2022)  Housing: Low Risk  (12/29/2022)  Transportation Needs: No Transportation Needs (12/29/2022)  Utilities: Not At Risk (12/29/2022)  Financial Resource Strain: Medium Risk (12/29/2022)  Tobacco Use: Medium Risk (12/28/2022)  Health Literacy: Adequate Health Literacy (12/29/2022)    Octavio Graves, MSW, LCSW Clinical Social Worker II [facility] Heart/Vascular Care Navigation  [phone]- work cell phone (preferred) [phone]- desk phone

## 2023-03-02 ENCOUNTER — Ambulatory Visit: Payer: Self-pay | Admitting: Physician Assistant

## 2023-03-19 ENCOUNTER — Ambulatory Visit: Payer: Self-pay

## 2023-03-19 DIAGNOSIS — E785 Hyperlipidemia, unspecified: Secondary | ICD-10-CM

## 2024-03-21 ENCOUNTER — Other Ambulatory Visit: Payer: Self-pay | Admitting: Physician Assistant

## 2024-03-21 DIAGNOSIS — E785 Hyperlipidemia, unspecified: Secondary | ICD-10-CM

## 2024-03-21 DIAGNOSIS — I10 Essential (primary) hypertension: Secondary | ICD-10-CM

## 2024-03-22 MED ORDER — ATORVASTATIN CALCIUM 80 MG PO TABS: 80.0000 mg | ORAL_TABLET | Freq: Every day | ORAL | 0 refills | Status: DC

## 2024-05-09 ENCOUNTER — Other Ambulatory Visit: Payer: Self-pay | Admitting: Physician Assistant
